# Patient Record
Sex: Female | Born: 2000
Health system: Southern US, Community
[De-identification: ages and names within clinical notes are randomized; demographics above are authoritative.]

## PROBLEM LIST (undated history)

## (undated) DIAGNOSIS — F32A Depression, unspecified: Secondary | ICD-10-CM

## (undated) DIAGNOSIS — F419 Anxiety disorder, unspecified: Secondary | ICD-10-CM

## (undated) DIAGNOSIS — F909 Attention-deficit hyperactivity disorder, unspecified type: Secondary | ICD-10-CM

## (undated) DIAGNOSIS — J45909 Unspecified asthma, uncomplicated: Secondary | ICD-10-CM

## (undated) DIAGNOSIS — F319 Bipolar disorder, unspecified: Secondary | ICD-10-CM

---
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
## 2021-01-04 MED FILL — Bupropion HCl Tab ER 24HR 300 MG: ORAL | 90 days supply | Qty: 90 | Fill #1 | Status: AC

## 2021-01-05 ENCOUNTER — Other Ambulatory Visit (HOSPITAL_COMMUNITY): Payer: Self-pay

## 2021-02-19 ENCOUNTER — Other Ambulatory Visit (HOSPITAL_COMMUNITY): Payer: Self-pay

## 2021-02-25 ENCOUNTER — Other Ambulatory Visit (HOSPITAL_COMMUNITY): Payer: Self-pay

## 2021-02-25 MED ORDER — AMPHETAMINE-DEXTROAMPHETAMINE 20 MG PO TABS
ORAL_TABLET | ORAL | 0 refills | Status: DC
  Filled 2021-02-25: qty 60, 30d supply, fill #0

## 2021-04-01 ENCOUNTER — Other Ambulatory Visit: Payer: Self-pay

## 2021-04-01 ENCOUNTER — Ambulatory Visit (INDEPENDENT_AMBULATORY_CARE_PROVIDER_SITE_OTHER): Payer: No Typology Code available for payment source | Admitting: Internal Medicine

## 2021-04-01 ENCOUNTER — Encounter: Payer: Self-pay | Admitting: Internal Medicine

## 2021-04-01 VITALS — BP 124/62 | HR 115 | Resp 18 | Ht 64.0 in | Wt 271.4 lb

## 2021-04-01 DIAGNOSIS — Z0000 Encounter for general adult medical examination without abnormal findings: Secondary | ICD-10-CM | POA: Diagnosis not present

## 2021-04-01 LAB — COMPREHENSIVE METABOLIC PANEL
ALT: 13 U/L (ref 0–35)
AST: 14 U/L (ref 0–37)
Albumin: 4.4 g/dL (ref 3.5–5.2)
Alkaline Phosphatase: 70 U/L (ref 47–119)
BUN: 12 mg/dL (ref 6–23)
CO2: 28 mEq/L (ref 19–32)
Calcium: 9.6 mg/dL (ref 8.4–10.5)
Chloride: 104 mEq/L (ref 96–112)
Creatinine, Ser: 0.91 mg/dL (ref 0.40–1.20)
GFR: 91.34 mL/min (ref >60.00)
Glucose, Bld: 87 mg/dL (ref 70–99)
Potassium: 3.9 mEq/L (ref 3.5–5.1)
Sodium: 139 mEq/L (ref 135–145)
Total Bilirubin: 0.3 mg/dL (ref 0.2–1.2)
Total Protein: 7.1 g/dL (ref 6.0–8.3)

## 2021-04-01 LAB — CBC
HCT: 38.3 % (ref 36.0–49.0)
Hemoglobin: 13 g/dL (ref 12.0–16.0)
MCHC: 34.1 g/dL (ref 31.0–37.0)
MCV: 86.9 fl (ref 78.0–98.0)
Platelets: 335 10*3/uL (ref 150.0–575.0)
RBC: 4.41 Mil/uL (ref 3.80–5.70)
RDW: 12.5 % (ref 11.4–15.5)
WBC: 7.8 10*3/uL (ref 4.5–13.5)

## 2021-04-01 LAB — TSH: TSH: 2.88 u[IU]/mL (ref 0.40–5.00)

## 2021-04-01 LAB — LIPID PANEL
Cholesterol: 134 mg/dL (ref 0–200)
HDL: 49.2 mg/dL (ref >39.00)
LDL Cholesterol: 73 mg/dL (ref 0–99)
NonHDL: 84.67
Total CHOL/HDL Ratio: 3
Triglycerides: 56 mg/dL (ref 0.0–149.0)
VLDL: 11.2 mg/dL (ref 0.0–40.0)

## 2021-04-01 LAB — HEMOGLOBIN A1C: Hgb A1c MFr Bld: 4.6 % (ref 4.6–6.5)

## 2021-04-01 NOTE — Progress Notes (Signed)
   Subjective:   Patient ID: Dawn Crosby, female    DOB: 2000-11-03, 20 y.o.   MRN: [id number]  HPI The patient is a new 20 YO female coming in for physical.   PMH, FMH, social history reviewed and updated  Review of Systems  Constitutional: Negative.   HENT: Negative.    Eyes: Negative.   Respiratory:  Negative for cough, chest tightness and shortness of breath.   Cardiovascular:  Negative for chest pain, palpitations and leg swelling.  Gastrointestinal:  Negative for abdominal distention, abdominal pain, constipation, diarrhea, nausea and vomiting.  Musculoskeletal: Negative.   Skin: Negative.   Neurological: Negative.   Psychiatric/Behavioral: Negative.     Objective:  Physical Exam Constitutional:      Appearance: She is well-developed. She is obese.  HENT:     Head: Normocephalic and atraumatic.  Cardiovascular:     Rate and Rhythm: Normal rate and regular rhythm.  Pulmonary:     Effort: Pulmonary effort is normal. No respiratory distress.     Breath sounds: Normal breath sounds. No wheezing or rales.  Abdominal:     General: Bowel sounds are normal. There is no distension.     Palpations: Abdomen is soft.     Tenderness: There is no abdominal tenderness. There is no rebound.  Musculoskeletal:     Cervical back: Normal range of motion.  Skin:    General: Skin is warm and dry.  Neurological:     Mental Status: She is alert and oriented to person, place, and time.     Coordination: Coordination normal.    Vitals:   04/01/21 1101  BP: 124/62  Pulse: (!) 115  Resp: 18  SpO2: 97%  Weight: 271 lb 6.4 oz (123.1 kg)  Height: 5\' 4"  (1.626 m)    This visit occurred during the SARS-CoV-2 public health emergency.  Safety protocols were in place, including screening questions prior to the visit, additional usage of staff PPE, and extensive cleaning of exam room while observing appropriate contact time as indicated for disinfecting solutions.   Assessment & Plan:

## 2021-04-01 NOTE — Patient Instructions (Addendum)
We will check the labs today. 

## 2021-04-02 ENCOUNTER — Other Ambulatory Visit (HOSPITAL_COMMUNITY): Payer: Self-pay

## 2021-04-02 DIAGNOSIS — Z0000 Encounter for general adult medical examination without abnormal findings: Secondary | ICD-10-CM | POA: Insufficient documentation

## 2021-04-02 MED ORDER — AMPHETAMINE-DEXTROAMPHETAMINE 20 MG PO TABS
ORAL_TABLET | ORAL | 0 refills | Status: DC
  Filled 2021-04-02: qty 60, 30d supply, fill #0

## 2021-04-02 NOTE — Assessment & Plan Note (Signed)
Checking labs including lipid and HgA1c given on mental health medications which can raise risk for metabolic syndrome and sugar and lipid problems.

## 2021-04-02 NOTE — Assessment & Plan Note (Signed)
Flu shot up to date. Covid-19 up to date. Tetanus up to date. Pap smear seeing gyn. Checking screening labs. Counseled about sun safety and mole surveillance. Counseled about the dangers of distracted driving. Given 10 year screening recommendations.

## 2021-04-07 ENCOUNTER — Other Ambulatory Visit (HOSPITAL_COMMUNITY): Payer: Self-pay

## 2021-04-09 ENCOUNTER — Other Ambulatory Visit (HOSPITAL_COMMUNITY): Payer: Self-pay

## 2021-04-09 MED ORDER — BUPROPION HCL ER (XL) 300 MG PO TB24
ORAL_TABLET | Freq: Every day | ORAL | 4 refills | Status: DC
  Filled 2021-04-09 (×2): qty 90, 90d supply, fill #0
  Filled 2021-07-07: qty 90, 90d supply, fill #1
  Filled 2021-10-13: qty 90, 90d supply, fill #2

## 2021-04-15 ENCOUNTER — Other Ambulatory Visit (HOSPITAL_COMMUNITY): Payer: Self-pay

## 2021-04-15 MED FILL — Lamotrigine Tab 200 MG: ORAL | 90 days supply | Qty: 90 | Fill #2 | Status: AC

## 2021-04-26 ENCOUNTER — Other Ambulatory Visit (HOSPITAL_COMMUNITY): Payer: Self-pay

## 2021-04-27 ENCOUNTER — Other Ambulatory Visit (HOSPITAL_COMMUNITY): Payer: Self-pay

## 2021-04-27 MED ORDER — IBUPROFEN 800 MG PO TABS
ORAL_TABLET | ORAL | 0 refills | Status: AC
  Filled 2021-04-27: qty 30, 10d supply, fill #0

## 2021-04-27 MED ORDER — HYDROCODONE-ACETAMINOPHEN 5-325 MG PO TABS
ORAL_TABLET | ORAL | 0 refills | Status: DC
  Filled 2021-04-27: qty 12, 4d supply, fill #0

## 2021-05-13 ENCOUNTER — Other Ambulatory Visit (HOSPITAL_COMMUNITY): Payer: Self-pay

## 2021-05-13 MED ORDER — AMPHETAMINE-DEXTROAMPHETAMINE 20 MG PO TABS
20.0000 mg | ORAL_TABLET | Freq: Two times a day (BID) | ORAL | 0 refills | Status: DC
  Filled 2021-05-13: qty 60, 30d supply, fill #0

## 2021-06-16 ENCOUNTER — Other Ambulatory Visit (HOSPITAL_COMMUNITY): Payer: Self-pay

## 2021-06-16 MED ORDER — AMPHETAMINE-DEXTROAMPHETAMINE 20 MG PO TABS
ORAL_TABLET | ORAL | 0 refills | Status: DC
  Filled 2021-06-16: qty 60, 30d supply, fill #0

## 2021-06-17 ENCOUNTER — Other Ambulatory Visit (HOSPITAL_COMMUNITY): Payer: Self-pay

## 2021-06-29 ENCOUNTER — Encounter (HOSPITAL_COMMUNITY): Payer: Self-pay | Admitting: Licensed Clinical Social Worker

## 2021-06-29 ENCOUNTER — Ambulatory Visit (INDEPENDENT_AMBULATORY_CARE_PROVIDER_SITE_OTHER): Payer: No Typology Code available for payment source | Admitting: Licensed Clinical Social Worker

## 2021-06-29 ENCOUNTER — Other Ambulatory Visit: Payer: Self-pay

## 2021-06-29 DIAGNOSIS — F900 Attention-deficit hyperactivity disorder, predominantly inattentive type: Secondary | ICD-10-CM

## 2021-06-29 DIAGNOSIS — F319 Bipolar disorder, unspecified: Secondary | ICD-10-CM

## 2021-06-29 DIAGNOSIS — F4010 Social phobia, unspecified: Secondary | ICD-10-CM

## 2021-06-29 NOTE — Progress Notes (Signed)
Virtual Visit via Video Note  I connected with Dawn Crosby on 06/29/21 at 11:00 AM EST by a video enabled telemedicine application and verified that I am speaking with the correct person using two identifiers.  Location: Patient: home Provider: office   I discussed the limitations of evaluation and management by telemedicine and the availability of in person appointments. The patient expressed understanding and agreed to proceed.  Type of Therapy: Individual Therapy   Treatment Goals addressed: "coping with stress, transitions, social anxiety". Dawn Crosby will improve her anxiety 5 out of 7 days as evidenced by reduction of sxs, ability to use coping skills, and increased confidence in herself and her decisions.    Interventions:  Motivational Interviewing   Summary: Dawn Crosby is a 21 y.o. female who presents with Bipolar Disorder, Social Anxiety and ADHD predominantly inattentive presentation   Suicidal/Homicidal: No without intent/plan   Therapist Response: Benjamine Mola met with clinician for an individual session. Chirstine discussed her psychiatric symptoms, her current life events and her homework.  Clinician received updates over the past several months since last session. Clinician utilized MI OARS to reflect and summarize thoughts and feelings of grief after the death of her pet rabbit. Clinician provided psychoeducation about grief and how it changes over time. 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 Clinician provided supportive counseling and discussed ways to seek support and love from her social network. Dawn Crosby provided updates about boyfriend and relationship.     Diagnosis: Bipolar Disorder, Social Anxiety and ADHD, inattentive   Plan: Next appointment in 2-3 weeks.      I discussed the assessment and treatment plan with the patient. The patient was provided an opportunity to ask questions and  all were answered. The patient agreed with the plan and demonstrated an understanding of the instructions.   The patient was advised to call back or seek an in-person evaluation if the symptoms worsen or if the condition fails to improve as anticipated.  I provided 55 minutes of non-face-to-face time during this encounter.   [name], LCSW

## 2021-07-01 ENCOUNTER — Ambulatory Visit (INDEPENDENT_AMBULATORY_CARE_PROVIDER_SITE_OTHER): Payer: No Typology Code available for payment source | Admitting: Adult Health

## 2021-07-01 ENCOUNTER — Other Ambulatory Visit: Payer: Self-pay

## 2021-07-01 ENCOUNTER — Encounter: Payer: Self-pay | Admitting: Adult Health

## 2021-07-01 VITALS — BP 107/69 | HR 96 | Ht 64.0 in | Wt 270.0 lb

## 2021-07-01 DIAGNOSIS — R112 Nausea with vomiting, unspecified: Secondary | ICD-10-CM | POA: Insufficient documentation

## 2021-07-01 DIAGNOSIS — F330 Major depressive disorder, recurrent, mild: Secondary | ICD-10-CM | POA: Diagnosis not present

## 2021-07-01 DIAGNOSIS — K8020 Calculus of gallbladder without cholecystitis without obstruction: Secondary | ICD-10-CM | POA: Insufficient documentation

## 2021-07-01 DIAGNOSIS — F4010 Social phobia, unspecified: Secondary | ICD-10-CM

## 2021-07-01 DIAGNOSIS — F900 Attention-deficit hyperactivity disorder, predominantly inattentive type: Secondary | ICD-10-CM

## 2021-07-01 DIAGNOSIS — R7989 Other specified abnormal findings of blood chemistry: Secondary | ICD-10-CM | POA: Insufficient documentation

## 2021-07-01 DIAGNOSIS — F319 Bipolar disorder, unspecified: Secondary | ICD-10-CM

## 2021-07-01 DIAGNOSIS — R1011 Right upper quadrant pain: Secondary | ICD-10-CM | POA: Insufficient documentation

## 2021-07-01 NOTE — Progress Notes (Signed)
[facility] MD/PA/NP Initial Note  07/01/2021 2:05 PM Dawn Crosby  MRN:  [id number]  Chief Complaint:   HPI:   Dawn Crosby is a 21 year old female seen today for initial psychiatric evaluation.   Describes mood today as "ok". Pleasant. Tearful at times. Mood symptoms - reports some depression, anxiety, and irritability - nothing overwhelming. Reports some highs and lows - mood more consistent with Lamictal. Followed by Dr. Toni Arthurs for the past 8 years and he is retiring soon. Feels stable with current medications and is hoping to establish care. Feels like current medications are working well. Stable interest and motivation. Taking medications as prescribed.  Started at [employer] after graduation - had gallbladder removal first semester and had a brake up her boyfriend the second semester. Thinking about returning to school to be a Runner, broadcasting/film/video. Has a passion for animals. Wanted to be an Administrator, arts. Had a rabbit "Tip" that passed in August of 2022 - had him for 6 and 1/2 Flunked out of school - put on probation. Feels like she is doing well - just trying to get things back on track.  Energy levels varies. Active, does not have a regular exercise routine.  Enjoys some usual interests and activities. Dating. Has a boyfriend. Spending time with family. Appetite adequate. Weight gain over the past year - 270 pounds. Sleeps well most nights. Averages 8 or more hours. Focus and concentration stable. Completing tasks. Managing aspects of household. Not employed currently - looking for a job. Denies SI or HI.  Denies AH or VH. Seeing therapist.   Previous medication trials: Prozac - rash.  Visit Diagnosis:    ICD-10-CM   1. Bipolar I disorder (HCC)  F31.9     2. ADHD (attention deficit hyperactivity disorder), inattentive type  F90.0     3. Social anxiety disorder  F40.10     4. MDD (major depressive disorder), recurrent episode, mild (HCC)  F33.0     5. Social phobia  F40.10        Past Psychiatric History: Denies psychiatric hospitalization.   Past Medical History:  Past Medical History:  Diagnosis Date   ADHD    Anxiety    Asthma    as child    Bipolar disorder (HCC)    Depression     Past Surgical History:  Procedure Laterality Date   CHOLECYSTECTOMY N/A 01/28/2020   Procedure: LAPAROSCOPIC CHOLECYSTECTOMY;  Surgeon: [name], MD;  Location: [facility];  Service: General;  Laterality: N/A;  GENERAL AND TAP BLOCK    Family Psychiatric History: Family history of mental illness.   Family History:  Family History  Problem Relation Age of Onset   Diabetes Mother    Depression Mother    Hyperlipidemia Father    Hypertension Father    Diabetes Father    Hyperlipidemia Maternal Grandfather     Social History:  Social History   Socioeconomic History   Marital status: Single    Spouse name: Not on file   Number of children: Not on file   Years of education: Not on file   Highest education level: Not on file  Occupational History   Not on file  Tobacco Use   Smoking status: Never   Smokeless tobacco: Never  Vaping Use   Vaping Use: Some days  Substance and Sexual Activity   Alcohol use: Not Currently   Drug use: Not Currently   Sexual activity: Not on file  Other Topics Concern   Not  on file  Social History Narrative   Not on file   Social Determinants of Health   Financial Resource Strain: Not on file  Food Insecurity: Not on file  Transportation Needs: Not on file  Physical Activity: Not on file  Stress: Not on file  Social Connections: Not on file    Allergies:  Allergies  Allergen Reactions   Bactrim [Sulfamethoxazole-Trimethoprim] Hives    Possible hives; pt was on bactrim and pcn, unsure which caused hives   Clindamycin/Lincomycin Hives   Kiwi Extract Other (See Comments)    Tongue tingling  Other reaction(s): tongue tingle   Penicillins Hives    Possible hives; pt was on bactrim and pcn,  unsure which caused hives   Pineapple Other (See Comments)    Tongue allergy  Other reaction(s): tongue tingle   Prozac [Fluoxetine Hcl] Hives   Adhesive [Tape] Rash   Clindamycin Rash   Fluoxetine Rash    Other reaction(s): hives, itching    Metabolic Disorder Labs: Lab Results  Component Value Date   HGBA1C 4.6 04/01/2021   No results found for: PROLACTIN Lab Results  Component Value Date   CHOL 134 04/01/2021   TRIG 56.0 04/01/2021   HDL 49.20 04/01/2021   CHOLHDL 3 04/01/2021   VLDL 11.2 04/01/2021   LDLCALC 73 04/01/2021   LDLCALC 63 12/31/2015   Lab Results  Component Value Date   TSH 2.88 04/01/2021   TSH 2.93 12/31/2015    Therapeutic Level Labs: No results found for: LITHIUM No results found for: VALPROATE No components found for:  CBMZ  Current Medications: Current Outpatient Medications  Medication Sig Dispense Refill   amphetamine-dextroamphetamine (ADDERALL) 20 MG tablet TAKE 1 TABLET BY MOUTH 2 TIMES DAILY FOR ADD 60 tablet 0   amphetamine-dextroamphetamine (ADDERALL) 20 MG tablet TAKE 1 TABLET BY MOUTH 2 TIMES DAILY 60 tablet 0   amphetamine-dextroamphetamine (ADDERALL) 30 MG tablet Take 40 mg by mouth daily.     buPROPion (WELLBUTRIN XL) 300 MG 24 hr tablet TAKE 1 TABLET BY MOUTH ONCE DAILY 90 tablet 4   busPIRone (BUSPAR) 10 MG tablet Take 1 tablet by mouth 3 times a day (Patient taking differently: Take 10 mg by mouth 2 (two) times daily.) 90 tablet 5   cetirizine (ZYRTEC) 10 MG tablet Take 10 mg by mouth at bedtime.     clonazePAM (KLONOPIN) 0.5 MG tablet Take 1 tablet by mouth once a day as needed for anxiety or panic 30 tablet 3   co-enzyme Q-10 30 MG capsule Take 30 mg by mouth daily.     diphenhydrAMINE (BENADRYL) 25 mg capsule Take 1 each (25 mg total) by mouth every 6 (six) hours as needed for allergies for up to 5 days. 12 capsule 0   docusate sodium (COLACE) 100 MG capsule Take 100 mg by mouth at bedtime.     HYDROcodone-acetaminophen  (NORCO/VICODIN) 5-325 MG tablet Take 1 tablet by mouth every 6 hours as needed for breakthrough pain 12 tablet 0   ibuprofen (ADVIL) 800 MG tablet Take 1 tablet by mouth every 8 hours with food 30 tablet 0   lamoTRIgine (LAMICTAL) 200 MG tablet TAKE 1 TABLET BY MOUTH ONCE A DAY 90 tablet 99   melatonin 5 MG TABS Take 5 mg by mouth at bedtime.     Multiple Vitamin (MULTIVITAMIN) capsule Take 1 capsule by mouth daily.     Omega-3 1000 MG CAPS Take 1 capsule by mouth at bedtime.     QUEtiapine (SEROQUEL) 100  MG tablet Take 1/2 to 1 tablet by mouth as needed for sleep 30 tablet 3   No current facility-administered medications for this visit.    Medication Side Effects: none  Orders placed this visit:  No orders of the defined types were placed in this encounter.   Psychiatric Specialty Exam:  Review of Systems  Musculoskeletal:  Negative for gait problem.  Neurological:  Negative for tremors.  Psychiatric/Behavioral:         Please refer to HPI   Blood pressure 107/69, pulse 96, height 5\' 4"  (1.626 m), weight 270 lb (122.5 kg).Body mass index is 46.35 kg/m.  General Appearance: Casual and Neat  Eye Contact:  Good  Speech:  Clear and Coherent and Normal Rate  Volume:  Normal  Mood:  Euthymic  Affect:  Appropriate and Congruent  Thought Process:  Coherent and Descriptions of Associations: Intact  Orientation:  Full (Time, Place, and Person)  Thought Content: Logical   Suicidal Thoughts:  No  Homicidal Thoughts:  No  Memory:  WNL  Judgement:  Good  Insight:  Good  Psychomotor Activity:  Normal  Concentration:  Concentration: Good  Recall:  Good  Fund of Knowledge: Good  Language: Good  Assets:  Communication Skills Desire for Improvement Financial Resources/Insurance Housing Intimacy Leisure Time Physical Health Resilience Social Support Talents/Skills Transportation Vocational/Educational  ADL's:  Intact  Cognition: WNL  Prognosis:  Good   Screenings:  Flowsheet  Row ED from 08/31/2020 in [facility] RISK CATEGORY No Risk      Receiving Psychotherapy: No   Treatment Plan/Recommendations:  Plan:  PDMP reviewed  Adderall 20mg  BID Buspar 10mg  BID Lamictal 200mg  daily Wellbutrin XL 300mg  in the am Seroquel 100mg  - 1/2 to one tablet as needed Clonazepam 0.5mg  as needed for anxiety  Will call if needs medications between visits.  3 months  Discussed potential benefits, risk, and side effects of benzodiazepines to include potential risk of tolerance and dependence, as well as possible drowsiness.  Advised patient not to drive if experiencing drowsiness and to take lowest possible effective dose to minimize risk of dependence and tolerance.   Discussed potential benefits, risks, and side effects of stimulants with patient to include increased heart rate, palpitations, insomnia, increased anxiety, increased irritability, or decreased appetite.  Instructed patient to contact office if experiencing any significant tolerability issues.   Discussed potential metabolic side effects associated with atypical antipsychotics, as well as potential risk for movement side effects. Advised pt to contact office if movement side effects occur.    Counseled patient regarding potential benefits, risks, and side effects of Lamictal to include potential risk of Stevens-Johnson syndrome. Advised patient to stop taking Lamictal and contact office immediately if rash develops and to seek urgent medical attention if rash is severe and/or spreading quickly. Will start Lamictal 25 mg daily for 2 weeks, then increase to 50 mg daily for 2 weeks, then 100 mg daily for 2 weeks, then 150 mg daily for mood symptoms.    Patient advised to contact office with any questions, adverse effects, or acute worsening in signs and symptoms.      [facility], NP

## 2021-07-08 ENCOUNTER — Other Ambulatory Visit (HOSPITAL_COMMUNITY): Payer: Self-pay

## 2021-07-15 ENCOUNTER — Ambulatory Visit (HOSPITAL_COMMUNITY): Payer: No Typology Code available for payment source | Admitting: Licensed Clinical Social Worker

## 2021-07-15 ENCOUNTER — Telehealth: Payer: Self-pay | Admitting: Adult Health

## 2021-07-15 ENCOUNTER — Other Ambulatory Visit (HOSPITAL_COMMUNITY): Payer: Self-pay

## 2021-07-15 ENCOUNTER — Other Ambulatory Visit: Payer: Self-pay

## 2021-07-15 NOTE — Telephone Encounter (Signed)
Pt stated she just needs you to send rx since she is no longer getting it from that provider

## 2021-07-15 NOTE — Telephone Encounter (Signed)
We can discuss at next appointment.

## 2021-07-15 NOTE — Telephone Encounter (Signed)
Patient called in stating that she was on Lamictal provider from a different provider. She would like the Lamictal switched to CR. Pls rtc [phone]. Appt 5/16. Pharmacy [facility] [place] ?

## 2021-07-16 ENCOUNTER — Other Ambulatory Visit: Payer: Self-pay

## 2021-07-16 ENCOUNTER — Other Ambulatory Visit (HOSPITAL_COMMUNITY): Payer: Self-pay

## 2021-07-16 MED ORDER — LAMOTRIGINE 200 MG PO TABS
ORAL_TABLET | Freq: Every day | ORAL | 0 refills | Status: DC
  Filled 2021-07-16: qty 90, 90d supply, fill #0

## 2021-07-16 NOTE — Telephone Encounter (Signed)
Noted  

## 2021-07-16 NOTE — Telephone Encounter (Signed)
We can send in current script. Why does she want to switch to a CR? ?

## 2021-07-16 NOTE — Telephone Encounter (Signed)
Pt has appt on 5/16, does she need to move it up?She will be out of meds

## 2021-07-16 NOTE — Telephone Encounter (Signed)
She does not.When I spoke to her she clarified and just needs what she normally takes.I will send

## 2021-07-29 ENCOUNTER — Other Ambulatory Visit: Payer: Self-pay

## 2021-07-29 ENCOUNTER — Ambulatory Visit (HOSPITAL_COMMUNITY): Payer: No Typology Code available for payment source | Admitting: Licensed Clinical Social Worker

## 2021-08-05 ENCOUNTER — Telehealth: Payer: Self-pay | Admitting: Adult Health

## 2021-08-05 ENCOUNTER — Other Ambulatory Visit (HOSPITAL_COMMUNITY): Payer: Self-pay

## 2021-08-05 ENCOUNTER — Other Ambulatory Visit: Payer: Self-pay

## 2021-08-05 MED ORDER — AMPHETAMINE-DEXTROAMPHETAMINE 20 MG PO TABS
ORAL_TABLET | ORAL | 0 refills | Status: DC
  Filled 2021-08-05: qty 60, 30d supply, fill #0

## 2021-08-05 NOTE — Telephone Encounter (Signed)
Pended.

## 2021-08-05 NOTE — Telephone Encounter (Signed)
Dawn Crosby called at 11:46 to request refill of her Adderall 20 mg 2/day.  Appt 09/28/21.  Send to [facility].  She said they have it. ?

## 2021-08-10 NOTE — Progress Notes (Signed)
?Dawn Crosby D.O. ?Dawn Crosby ?[address] [place] [address] ?Phone: [phone] ?Subjective:   ?I, Dawn Crosby, am serving as a scribe for Dr. Antoine Crosby. ? ?This visit occurred during the SARS-CoV-2 public health emergency.  Safety protocols were in place, including screening questions prior to the visit, additional usage of staff PPE, and extensive cleaning of exam room while observing appropriate contact time as indicated for disinfecting solutions.  ? ? ?I'm seeing this patient by the request  of:  Dawn Broker, MD ? ?CC: left wrist pain  ? ?[id number]  ?Dawn Crosby is a 21 y.o. female coming in with complaint of left wrist pain. Patient states that she has had L wrist pain for past 2 years that is intermittent. Pain CMC joint and back of wrist. Has had wrist adjusted which helped temporarily.   ? ? ? ?  ? ?Past Medical History:  ?Diagnosis Date  ? ADHD   ? Anxiety   ? Asthma   ? as child   ? Bipolar disorder (HCC)   ? Depression   ? ?Past Surgical History:  ?Procedure Laterality Date  ? CHOLECYSTECTOMY N/A 01/28/2020  ? Procedure: LAPAROSCOPIC CHOLECYSTECTOMY;  Surgeon: Dawn Loron, MD;  Location: [facility];  Service: General;  Laterality: N/A;  GENERAL AND TAP BLOCK  ? ?Social History  ? ?Socioeconomic History  ? Marital status: Single  ?  Spouse name: Not on file  ? Number of children: Not on file  ? Years of education: Not on file  ? Highest education level: Not on file  ?Occupational History  ? Not on file  ?Tobacco Use  ? Smoking status: Never  ? Smokeless tobacco: Never  ?Vaping Use  ? Vaping Use: Some days  ?Substance and Sexual Activity  ? Alcohol use: Not Currently  ? Drug use: Not Currently  ? Sexual activity: Not on file  ?Other Topics Concern  ? Not on file  ?Social History Narrative  ? Not on file  ? ?Social Determinants of Health  ? ?Financial Resource Strain: Not on file  ?Food Insecurity: Not on file  ?Transportation Needs: Not  on file  ?Physical Activity: Not on file  ?Stress: Not on file  ?Social Connections: Not on file  ? ?Allergies  ?Allergen Reactions  ? Bactrim [Sulfamethoxazole-Trimethoprim] Hives  ?  Possible hives; pt was on bactrim and pcn, unsure which caused hives  ? Clindamycin/Lincomycin Hives  ? Kiwi Extract Other (See Comments)  ?  Tongue tingling ? ?Other reaction(s): tongue tingle  ? Penicillins Hives  ?  Possible hives; pt was on bactrim and pcn, unsure which caused hives  ? Pineapple Other (See Comments)  ?  Tongue allergy ? ?Other reaction(s): tongue tingle  ? Prozac [Fluoxetine Hcl] Hives  ? Adhesive [Tape] Rash  ? Clindamycin Rash  ? Fluoxetine Rash  ?  Other reaction(s): hives, itching  ? ?Family History  ?Problem Relation Age of Onset  ? Diabetes Mother   ? Depression Mother   ? Hyperlipidemia Father   ? Hypertension Father   ? Diabetes Father   ? Hyperlipidemia Maternal Grandfather   ? ? ? ? ?Current Outpatient Medications (Respiratory):  ?  cetirizine (ZYRTEC) 10 MG tablet, Take 10 mg by mouth at bedtime. ?  diphenhydrAMINE (BENADRYL) 25 mg capsule, Take 1 each (25 mg total) by mouth every 6 (six) hours as needed for allergies for up to 5 days. ? ?Current Outpatient Medications (Analgesics):  ?  ibuprofen (ADVIL)  800 MG tablet, Take 1 tablet by mouth every 8 hours with food ?  HYDROcodone-acetaminophen (NORCO/VICODIN) 5-325 MG tablet, Take 1 tablet by mouth every 6 hours as needed for breakthrough pain (Patient not taking: Reported on 07/29/2021) ? ? ?Current Outpatient Medications (Other):  ?  amphetamine-dextroamphetamine (ADDERALL) 20 MG tablet, TAKE 1 TABLET BY MOUTH 2 TIMES DAILY FOR ADD ?  buPROPion (WELLBUTRIN XL) 300 MG 24 hr tablet, TAKE 1 TABLET BY MOUTH ONCE DAILY ?  busPIRone (BUSPAR) 10 MG tablet, Take 1 tablet by mouth 3 times a day (Patient taking differently: Take 10 mg by mouth 2 (two) times daily.) ?  clonazePAM (KLONOPIN) 0.5 MG tablet, Take 1 tablet by mouth once a day as needed for anxiety or  panic ?  co-enzyme Q-10 30 MG capsule, Take 30 mg by mouth daily. ?  docusate sodium (COLACE) 100 MG capsule, Take 100 mg by mouth at bedtime. ?  lamoTRIgine (LAMICTAL) 200 MG tablet, TAKE 1 TABLET BY MOUTH ONCE A DAY ?  melatonin 5 MG TABS, Take 5 mg by mouth at bedtime. ?  Multiple Vitamin (MULTIVITAMIN) capsule, Take 1 capsule by mouth daily. ?  Omega-3 1000 MG CAPS, Take 1 capsule by mouth at bedtime. ?  QUEtiapine (SEROQUEL) 100 MG tablet, Take 1/2 to 1 tablet by mouth as needed for sleep ?  amphetamine-dextroamphetamine (ADDERALL) 20 MG tablet, TAKE 1 TABLET BY MOUTH 2 TIMES DAILY ?  amphetamine-dextroamphetamine (ADDERALL) 30 MG tablet, Take 40 mg by mouth daily. ? ? ?Reviewed prior external information including notes and imaging from  ?primary care provider ?As well as notes that were available from care everywhere and other healthcare systems. ? ?Past medical history, social, surgical and family history all reviewed in electronic medical record.  No pertanent information unless stated regarding to the chief complaint.  ? ?Review of Systems: ? No headache, visual changes, nausea, vomiting, diarrhea, constipation, dizziness, abdominal pain, skin rash, fevers, chills, night sweats, weight loss, swollen lymph nodes, body aches, joint swelling, chest pain, shortness of breath, mood changes. POSITIVE muscle aches ? ?Objective  ?Blood pressure 104/74, pulse (!) 112, height 5\' 4"  (1.626 m), weight 270 lb (122.5 kg). ?  ?General: No apparent distress alert and oriented x3 mood and affect normal, dressed appropriately.  ?HEENT: Pupils equal, extraocular movements intact  ?Respiratory: Patient's speak in full sentences and does not appear short of breath  ?Cardiovascular: No lower extremity edema, non tender, no erythema  ?Gait normal with good balance and coordination.  ?MSK: Left wrist moderately tender at the [facility] joint with just proximal to it in that area.  Possible finding noted on the volar aspect of the wrist.   Patient has good grip strength noted.  Mild tenderness over the scapholunate ligament but no significant instability ? ?Limited muscular skeletal ultrasound was performed and interpreted by [facility], M  ?Limited ultrasound shows the patient does have a hypoechoic mass noted just proximal to the [facility] joint.  This is deep to the radial artery.  It does seem to be a bit ganglion cyst.  No blood flow in the area.  No abnormal blood vessels. ?Impression: Likely ganglion cyst. ? ?  ?Impression and Recommendations:  ?  ? ?The above documentation has been reviewed and is accurate and complete [facility], DO ? ? ? ?

## 2021-08-11 ENCOUNTER — Ambulatory Visit: Payer: Self-pay

## 2021-08-11 ENCOUNTER — Ambulatory Visit: Payer: No Typology Code available for payment source | Admitting: Family Medicine

## 2021-08-11 VITALS — BP 104/74 | HR 112 | Ht 64.0 in | Wt 270.0 lb

## 2021-08-11 DIAGNOSIS — M67432 Ganglion, left wrist: Secondary | ICD-10-CM | POA: Diagnosis not present

## 2021-08-11 DIAGNOSIS — M25532 Pain in left wrist: Secondary | ICD-10-CM

## 2021-08-11 NOTE — Patient Instructions (Signed)
Ganglion cyst ?Watch it and it might pop-will be painful for just a couple of days ?C[place]an with activity ?See me if it enlarges otherwise as needed ?

## 2021-08-11 NOTE — Assessment & Plan Note (Signed)
Does appear to be a ganglion cyst noted.  Discussed with patient that this is underneath the radial artery which would make aspiration somewhat difficult.  Discussed that the likelihood is this could potentially resolve to on its own .  Patient has been doing more repetitive lifting recently and probably did cause some increase in size recently.  Patient will follow-up with me again if any enlargement or worsening pain. ?

## 2021-08-12 ENCOUNTER — Ambulatory Visit (INDEPENDENT_AMBULATORY_CARE_PROVIDER_SITE_OTHER): Payer: No Typology Code available for payment source | Admitting: Licensed Clinical Social Worker

## 2021-08-12 DIAGNOSIS — F900 Attention-deficit hyperactivity disorder, predominantly inattentive type: Secondary | ICD-10-CM

## 2021-08-12 DIAGNOSIS — F319 Bipolar disorder, unspecified: Secondary | ICD-10-CM | POA: Diagnosis not present

## 2021-08-12 DIAGNOSIS — F4010 Social phobia, unspecified: Secondary | ICD-10-CM | POA: Diagnosis not present

## 2021-08-16 ENCOUNTER — Ambulatory Visit: Payer: No Typology Code available for payment source | Admitting: Internal Medicine

## 2021-08-16 ENCOUNTER — Encounter (HOSPITAL_COMMUNITY): Payer: Self-pay | Admitting: Licensed Clinical Social Worker

## 2021-08-16 NOTE — Progress Notes (Signed)
Virtual Visit via Video Note ? ?I connected with Dawn Crosby on 08/16/21 at  1:30 PM EDT by a video enabled telemedicine application and verified that I am speaking with the correct person using two identifiers. ? ?Location: ?Patient: home ?Provider: home office ?  ?I discussed the limitations of evaluation and management by telemedicine and the availability of in person appointments. The patient expressed understanding and agreed to proceed. ?  ?I discussed the assessment and treatment plan with the patient. The patient was provided an opportunity to ask questions and all were answered. The patient agreed with the plan and demonstrated an understanding of the instructions. ?  ?The patient was advised to call back or seek an in-person evaluation if the symptoms worsen or if the condition fails to improve as anticipated. ? ?I provided 55 minutes of non-face-to-face time during this encounter. ? ? ?[name], LCSW  ? ?THERAPIST PROGRESS NOTE ? ?Session Time: 1:30pm-2:25pm ? ?Participation Level: Active ? ?Behavioral Response: NeatAlertEuthymic ? ?Type of Therapy: Individual Therapy ? ?Treatment Goals addressed: "coping with stress, transitions, social anxiety". Dawn Crosby will improve her anxiety 5 out of 7 days as evidenced by reduction of sxs, ability to use coping skills, and increased confidence in herself and her decisions.  ? ?ProgressTowards Goals: Progressing ? ?Interventions: CBT ? ?Summary: MARICELLA FILYAW is a 21 y.o. female who presents with Bipolar I Disorder, Social Anxiety, and ADHD, inattentive.  ? ?Suicidal/Homicidal: Nowithout intent/plan ? ?Therapist Response: Dawn Crosby engaged well in individual session with clinician. Clinician utilized CBT to process updates in life at home and in community. Dawn Crosby shared ongoing progress in cleaning out her room. She also shared plans to paint and redo flooring. Clinician explored interactions with friends and family. Dawn Crosby provided updates about the  drama of her friends and family's lives. Clinician processed Dawn Crosby's ability to maintain appropriate boundaries with her friends and family who have a lot of "drama" that she gets involved in. Clinician discussed plans for the future. Dawn Crosby identified a semi-crisis in that she always wanted to be a International aid/development worker, but she said she could not deal with the constant flow of euthanasia and sick or dying animals. Clinician agreed and explored alternatives. Dawn Crosby shared some interest in becoming an Insurance account manager, possibly an Wellsite geologist. Clinician agreed that this would be a nice area to focus her attention. Clinician provided [email or web address] for career testing.  ? ?Plan: Return again in 2 weeks. ? ?Diagnosis: Bipolar I disorder (HCC) ? ?Social anxiety disorder ? ?ADHD (attention deficit hyperactivity disorder), inattentive type ? ?Collaboration of Care: Other none required at this session.  ? ?Patient/Guardian was advised Release of Information must be obtained prior to any record release in order to collaborate their care with an outside provider. Patient/Guardian was advised if they have not already done so to contact the registration department to sign all necessary forms in order for [place] to release information regarding their care.  ? ?Consent: Patient/Guardian gives verbal consent for treatment and assignment of benefits for services provided during this visit. Patient/Guardian expressed understanding and agreed to proceed.  ? ?[name], LCSW ?08/16/2021 ? ?

## 2021-09-01 ENCOUNTER — Ambulatory Visit: Payer: No Typology Code available for payment source | Admitting: Internal Medicine

## 2021-09-02 ENCOUNTER — Ambulatory Visit (HOSPITAL_COMMUNITY): Payer: No Typology Code available for payment source | Admitting: Licensed Clinical Social Worker

## 2021-09-09 ENCOUNTER — Other Ambulatory Visit (HOSPITAL_COMMUNITY): Payer: Self-pay

## 2021-09-09 ENCOUNTER — Other Ambulatory Visit: Payer: Self-pay | Admitting: Adult Health

## 2021-09-09 MED ORDER — AMPHETAMINE-DEXTROAMPHETAMINE 20 MG PO TABS
ORAL_TABLET | ORAL | 0 refills | Status: DC
  Filled 2021-09-09: qty 60, 30d supply, fill #0

## 2021-09-10 ENCOUNTER — Other Ambulatory Visit (HOSPITAL_COMMUNITY): Payer: Self-pay

## 2021-09-22 ENCOUNTER — Encounter (HOSPITAL_COMMUNITY): Payer: Self-pay | Admitting: Licensed Clinical Social Worker

## 2021-09-22 ENCOUNTER — Ambulatory Visit (INDEPENDENT_AMBULATORY_CARE_PROVIDER_SITE_OTHER): Payer: No Typology Code available for payment source | Admitting: Licensed Clinical Social Worker

## 2021-09-22 DIAGNOSIS — F900 Attention-deficit hyperactivity disorder, predominantly inattentive type: Secondary | ICD-10-CM

## 2021-09-22 DIAGNOSIS — F4010 Social phobia, unspecified: Secondary | ICD-10-CM

## 2021-09-22 DIAGNOSIS — F319 Bipolar disorder, unspecified: Secondary | ICD-10-CM | POA: Diagnosis not present

## 2021-09-22 NOTE — Progress Notes (Signed)
Virtual Visit via Video Note ? ?I connected with Dawn Crosby on 09/22/21 at  1:30 PM EDT by a video enabled telemedicine application and verified that I am speaking with the correct person using two identifiers. ? ?Location: ?Patient: home ?Provider: home office ?  ?I discussed the limitations of evaluation and management by telemedicine and the availability of in person appointments. The patient expressed understanding and agreed to proceed. ? ?  ?I discussed the assessment and treatment plan with the patient. The patient was provided an opportunity to ask questions and all were answered. The patient agreed with the plan and demonstrated an understanding of the instructions. ?  ?The patient was advised to call back or seek an in-person evaluation if the symptoms worsen or if the condition fails to improve as anticipated. ? ?I provided 55 minutes of non-face-to-face time during this encounter. ? ? ?[name], LCSW  ? ?THERAPIST PROGRESS NOTE ? ?Session Time: 1:30pm-2:25pm ? ?Participation Level: Active ? ?Behavioral Response: CasualAlertEuthymic ? ?Type of Therapy: Individual Therapy ? ?Treatment Goals addressed:  "coping with stress, transitions, social anxiety". Dawn Crosby will improve her anxiety 5 out of 7 days as evidenced by reduction of sxs, ability to use coping skills, and increased confidence in herself and her decisions.  ? ?ProgressTowards Goals: Progressing ? ?Interventions: CBT ? ?Summary: Dawn Crosby is a 21 y.o. female who presents with Bipolar I Disorder, Social Anxiety Disorder, and ADHD inattentive .  ? ?Suicidal/Homicidal: Nowithout intent/plan ? ?Therapist Response: Dawn Crosby engaged well in individual telehealth session. Clinician processed interactions with family and friends since last session. Clinician processed updates in relationship with boyfriend, noting increased visits and positive relationship with him and his family. Clinician discussed differences between this  relationship and past relationships. Clinician also processed different choices [name] makes and how she can be more honest and feel better supported by this boyfriend. Clinician discussed updates in life, work, cleaning her room, etc. Clinician utilized CBT in processing her decision making as well as in thinking about the present and the future.  ? ?Plan: Return again in 2 weeks. ? ?Diagnosis: Bipolar I disorder (HCC) ? ?Social anxiety disorder ? ?ADHD (attention deficit hyperactivity disorder), inattentive type ? ?Collaboration of Care: Other none required in this session. Reviewed last note from med provider ? ?Patient/Guardian was advised Release of Information must be obtained prior to any record release in order to collaborate their care with an outside provider. Patient/Guardian was advised if they have not already done so to contact the registration department to sign all necessary forms in order for [place] to release information regarding their care.  ? ?Consent: Patient/Guardian gives verbal consent for treatment and assignment of benefits for services provided during this visit. Patient/Guardian expressed understanding and agreed to proceed.  ? ?[name], LCSW ?09/22/2021 ? ?

## 2021-09-28 ENCOUNTER — Ambulatory Visit: Payer: No Typology Code available for payment source | Admitting: Adult Health

## 2021-10-06 ENCOUNTER — Ambulatory Visit (INDEPENDENT_AMBULATORY_CARE_PROVIDER_SITE_OTHER): Payer: No Typology Code available for payment source | Admitting: Licensed Clinical Social Worker

## 2021-10-06 DIAGNOSIS — F900 Attention-deficit hyperactivity disorder, predominantly inattentive type: Secondary | ICD-10-CM | POA: Diagnosis not present

## 2021-10-06 DIAGNOSIS — F4010 Social phobia, unspecified: Secondary | ICD-10-CM | POA: Diagnosis not present

## 2021-10-06 DIAGNOSIS — F319 Bipolar disorder, unspecified: Secondary | ICD-10-CM

## 2021-10-12 ENCOUNTER — Encounter (HOSPITAL_COMMUNITY): Payer: Self-pay | Admitting: Licensed Clinical Social Worker

## 2021-10-12 NOTE — Progress Notes (Signed)
Virtual Visit via Video Note  I connected with Dawn Crosby on 10/12/21 at  1:30 PM EDT by a video enabled telemedicine application and verified that I am speaking with the correct person using two identifiers.  Location: Patient: home Provider: home office   I discussed the limitations of evaluation and management by telemedicine and the availability of in person appointments. The patient expressed understanding and agreed to proceed.    I discussed the assessment and treatment plan with the patient. The patient was provided an opportunity to ask questions and all were answered. The patient agreed with the plan and demonstrated an understanding of the instructions.   The patient was advised to call back or seek an in-person evaluation if the symptoms worsen or if the condition fails to improve as anticipated.  I provided 55 minutes of non-face-to-face time during this encounter.   [name], LCSW   THERAPIST PROGRESS NOTE  Session Time: 1:30pm-2:25pm  Participation Level: Active  Behavioral Response: NeatAlertEuthymic  Type of Therapy: Individual Therapy  Treatment Goals addressed: "coping with stress, transitions, social anxiety". Dawn Crosby will improve her anxiety 5 out of 7 days as evidenced by reduction of sxs, ability to use coping skills, and increased confidence in herself and her decisions.   ProgressTowards Goals: Progressing  Interventions: CBT  Summary: Dawn Crosby is a 21 y.o. female who presents with Bipolar I disorder, Social Anxiety, and ADHD inattentive.   Suicidal/Homicidal: Nowithout intent/plan  Therapist Response: Dawn Crosby engaged well in individual telehealth session with clinician. Clinician discussed interactions, moods, and thought processes since last session. Dawn Crosby provided updates and processed family hx of mental illness. Clinician discussed the challenges of other family members in coping with mental illness, as well as Dawn Crosby's own  experiences with the beginning stages of Bipolar Disorder. Clinician discussed coping skills that can assist with mood, or pre-empt anxiety. Clinician also identified the ongoing challenges in completing the task of clearing out her room and starting fresh. Clinician discussed relationship with boyfriend and noted positive interactions and a lot of support.   Plan: Return again in 2 weeks.  Diagnosis: Bipolar I disorder (HCC)  Social anxiety disorder  ADHD (attention deficit hyperactivity disorder), inattentive type  Collaboration of Care: Other none required in this session  Patient/Guardian was advised Release of Information must be obtained prior to any record release in order to collaborate their care with an outside provider. Patient/Guardian was advised if they have not already done so to contact the registration department to sign all necessary forms in order for [place] to release information regarding their care.   Consent: Patient/Guardian gives verbal consent for treatment and assignment of benefits for services provided during this visit. Patient/Guardian expressed understanding and agreed to proceed.   Chryl Heck [place], LCSW 10/12/2021

## 2021-10-14 ENCOUNTER — Other Ambulatory Visit (HOSPITAL_COMMUNITY): Payer: Self-pay

## 2021-10-20 ENCOUNTER — Encounter: Payer: Self-pay | Admitting: Adult Health

## 2021-10-20 ENCOUNTER — Other Ambulatory Visit (HOSPITAL_COMMUNITY): Payer: Self-pay

## 2021-10-20 ENCOUNTER — Ambulatory Visit (INDEPENDENT_AMBULATORY_CARE_PROVIDER_SITE_OTHER): Payer: No Typology Code available for payment source | Admitting: Adult Health

## 2021-10-20 DIAGNOSIS — F330 Major depressive disorder, recurrent, mild: Secondary | ICD-10-CM | POA: Diagnosis not present

## 2021-10-20 DIAGNOSIS — F319 Bipolar disorder, unspecified: Secondary | ICD-10-CM | POA: Diagnosis not present

## 2021-10-20 DIAGNOSIS — F900 Attention-deficit hyperactivity disorder, predominantly inattentive type: Secondary | ICD-10-CM

## 2021-10-20 DIAGNOSIS — F4010 Social phobia, unspecified: Secondary | ICD-10-CM | POA: Diagnosis not present

## 2021-10-20 MED ORDER — LAMOTRIGINE 200 MG PO TABS
ORAL_TABLET | Freq: Every day | ORAL | 3 refills | Status: DC
  Filled 2021-10-20: qty 90, 90d supply, fill #0
  Filled 2022-01-19: qty 90, 90d supply, fill #1
  Filled 2022-04-20 – 2022-05-28 (×2): qty 90, 90d supply, fill #2

## 2021-10-20 MED ORDER — BUPROPION HCL ER (XL) 300 MG PO TB24
ORAL_TABLET | Freq: Every day | ORAL | 3 refills | Status: DC
  Filled 2021-10-20: qty 90, fill #0
  Filled 2022-01-12: qty 90, 90d supply, fill #0
  Filled 2022-04-20 – 2022-05-04 (×2): qty 90, 90d supply, fill #1

## 2021-10-20 MED ORDER — AMPHETAMINE-DEXTROAMPHETAMINE 20 MG PO TABS: 20.0000 mg | ORAL_TABLET | Freq: Two times a day (BID) | ORAL | 0 refills | Status: DC

## 2021-10-20 MED ORDER — BUSPIRONE HCL 10 MG PO TABS
ORAL_TABLET | ORAL | 3 refills | Status: DC
  Filled 2021-10-20: qty 180, 90d supply, fill #0
  Filled 2022-01-19: qty 180, 90d supply, fill #1
  Filled 2022-05-11: qty 180, 90d supply, fill #2

## 2021-10-20 MED ORDER — AMPHETAMINE-DEXTROAMPHETAMINE 20 MG PO TABS
ORAL_TABLET | Freq: Two times a day (BID) | ORAL | 0 refills | Status: DC
  Filled 2022-01-05: qty 60, 30d supply, fill #0

## 2021-10-20 MED ORDER — AMPHETAMINE-DEXTROAMPHETAMINE 20 MG PO TABS
ORAL_TABLET | ORAL | 0 refills | Status: DC
  Filled 2021-10-20: qty 60, 30d supply, fill #0

## 2021-10-20 NOTE — Progress Notes (Signed)
DELAPHINE DALMAU [id number] 10-Dec-2000 21 y.o.  Subjective:   Patient ID:  Dawn Crosby is a 21 y.o. (DOB May 09, 2001) female.  Chief Complaint: No chief complaint on file.   HPI Dawn Crosby presents to the office today for follow-up of Bipolar disorder, ADHD, SAD, MDD, Social phobia.  Describes mood today as "ok". Pleasant. Tearful at times. Mood symptoms - reports some depression and anxiety. Feels irritable at times. Reports some highs and lows - usually when coming back from her boyfriend house. Feels like his house is less chaotic house versus her house. Feels like current medications are working well. Stable interest and motivation. Taking medications as prescribed.  Energy levels varies. Active, does not have a regular exercise routine.  Enjoys some usual interests and activities. Dating. Has a boyfriend. Living at home. Spending time with family. Appetite adequate. Weight stable - 270 pounds. Sleeps well most nights. Averages 8 or more hours. Focus and concentration stable. Completing tasks. Managing aspects of household. Unemployed.  Denies SI or HI.  Denies AH or VH. Seeing therapist.  Self harm x 400 days.  Previous medication trials: Prozac - rash.   [place] ED from 08/31/2020 in [facility] [place] [facility]        Review of Systems:  Review of Systems  Musculoskeletal:  Negative for gait problem.  Neurological:  Negative for tremors.  Psychiatric/Behavioral:         Please refer to HPI   Medications: I have reviewed the patient's current medications.  Current Outpatient Medications  Medication Sig Dispense Refill   [START ON 12/15/2021] amphetamine-dextroamphetamine (ADDERALL) 20 MG tablet Take 1 tablet (20 mg total) by mouth 2 (two) times daily. 60 tablet 0   amphetamine-dextroamphetamine (ADDERALL) 20 MG tablet TAKE 1 TABLET BY MOUTH 2 TIMES DAILY FOR ADD 60 tablet 0   [START ON 11/17/2021]  amphetamine-dextroamphetamine (ADDERALL) 20 MG tablet TAKE 1 TABLET BY MOUTH 2 TIMES DAILY 60 tablet 0   buPROPion (WELLBUTRIN XL) 300 MG 24 hr tablet TAKE 1 TABLET BY MOUTH ONCE DAILY 90 tablet 3   busPIRone (BUSPAR) 10 MG tablet Take one tablet twice daily. 180 tablet 3   cetirizine (ZYRTEC) 10 MG tablet Take 10 mg by mouth at bedtime.     clonazePAM (KLONOPIN) 0.5 MG tablet Take 1 tablet by mouth once a day as needed for anxiety or panic 30 tablet 3   co-enzyme Q-10 30 MG capsule Take 30 mg by mouth daily.     diphenhydrAMINE (BENADRYL) 25 mg capsule Take 1 each (25 mg total) by mouth every 6 (six) hours as needed for allergies for up to 5 days. 12 capsule 0   docusate sodium (COLACE) 100 MG capsule Take 100 mg by mouth at bedtime.     HYDROcodone-acetaminophen (NORCO/VICODIN) 5-325 MG tablet Take 1 tablet by mouth every 6 hours as needed for breakthrough pain (Patient not taking: Reported on 07/29/2021) 12 tablet 0   ibuprofen (ADVIL) 800 MG tablet Take 1 tablet by mouth every 8 hours with food 30 tablet 0   lamoTRIgine (LAMICTAL) 200 MG tablet TAKE 1 TABLET BY MOUTH ONCE A DAY 90 tablet 3   melatonin 5 MG TABS Take 5 mg by mouth at bedtime.     Multiple Vitamin (MULTIVITAMIN) capsule Take 1 capsule by mouth daily.     Omega-3 1000 MG CAPS Take 1 capsule by mouth at bedtime.     QUEtiapine (SEROQUEL) 100 MG tablet  Take 1/2 to 1 tablet by mouth as needed for sleep 30 tablet 3   No current facility-administered medications for this visit.    Medication Side Effects: None  Allergies:  Allergies  Allergen Reactions   Bactrim [Sulfamethoxazole-Trimethoprim] Hives    Possible hives; pt was on bactrim and pcn, unsure which caused hives   Clindamycin/Lincomycin Hives   Kiwi Extract Other (See Comments)    Tongue tingling  Other reaction(s): tongue tingle   Penicillins Hives    Possible hives; pt was on bactrim and pcn, unsure which caused hives   Pineapple Other (See Comments)    Tongue  allergy  Other reaction(s): tongue tingle   Prozac [Fluoxetine Hcl] Hives   Adhesive [Tape] Rash   Clindamycin Rash   Fluoxetine Rash    Other reaction(s): hives, itching    Past Medical History:  Diagnosis Date   ADHD    Anxiety    Asthma    as child    Bipolar disorder ([place])    Depression     Past Medical History, Surgical history, Social history, and Family history were reviewed and updated as appropriate.   Please see review of systems for further details on the patient's review from today.   Objective:   Physical Exam:  There were no vitals taken for this visit.  Physical Exam Constitutional:      General: She is not in acute distress. Musculoskeletal:        General: No deformity.  Neurological:     Mental Status: She is alert and oriented to person, place, and time.     Coordination: Coordination normal.  Psychiatric:        Attention and Perception: Attention and perception normal. She does not perceive auditory or visual hallucinations.        Mood and Affect: Mood normal. Mood is not anxious or depressed. Affect is not labile, blunt, angry or inappropriate.        Speech: Speech normal.        Behavior: Behavior normal.        Thought Content: Thought content normal. Thought content is not paranoid or delusional. Thought content does not include homicidal or suicidal ideation. Thought content does not include homicidal or suicidal plan.        Cognition and Memory: Cognition and memory normal.        Judgment: Judgment normal.     Comments: Insight intact    Lab Review:     Component Value Date/Time   NA 139 04/01/2021 1143   K 3.9 04/01/2021 1143   CL 104 04/01/2021 1143   CO2 28 04/01/2021 1143   GLUCOSE 87 04/01/2021 1143   BUN 12 04/01/2021 1143   CREATININE 0.91 04/01/2021 1143   CREATININE 0.69 12/31/2015 0001   CALCIUM 9.6 04/01/2021 1143   PROT 7.1 04/01/2021 1143   ALBUMIN 4.4 04/01/2021 1143   AST 14 04/01/2021 1143   ALT 13  04/01/2021 1143   ALKPHOS 70 04/01/2021 1143   BILITOT 0.3 04/01/2021 1143       Component Value Date/Time   WBC 7.8 04/01/2021 1143   RBC 4.41 04/01/2021 1143   HGB 13.0 04/01/2021 1143   HCT 38.3 04/01/2021 1143   PLT 335.0 04/01/2021 1143   MCV 86.9 04/01/2021 1143   MCHC 34.1 04/01/2021 1143   RDW 12.5 04/01/2021 1143    No results found for: POCLITH, LITHIUM   No results found for: PHENYTOIN, PHENOBARB, VALPROATE, CBMZ   .res Assessment:  Plan:    Plan:  PDMP reviewed  Adderall 20mg  BID Buspar 10mg  BID Lamictal 200mg  daily Wellbutrin XL 300mg  in the am  None needed - Seroquel 100mg  - 1/2 to one tablet as needed None needed - Clonazepam 0.5mg  as needed for anxiety  3 months  Discussed potential benefits, risk, and side effects of benzodiazepines to include potential risk of tolerance and dependence, as well as possible drowsiness.  Advised patient not to drive if experiencing drowsiness and to take lowest possible effective dose to minimize risk of dependence and tolerance.   Discussed potential benefits, risks, and side effects of stimulants with patient to include increased heart rate, palpitations, insomnia, increased anxiety, increased irritability, or decreased appetite.  Instructed patient to contact office if experiencing any significant tolerability issues.   Discussed potential metabolic side effects associated with atypical antipsychotics, as well as potential risk for movement side effects. Advised pt to contact office if movement side effects occur.    Counseled patient regarding potential benefits, risks, and side effects of Lamictal to include potential risk of Stevens-Johnson syndrome. Advised patient to stop taking Lamictal and contact office immediately if rash develops and to seek urgent medical attention if rash is severe and/or spreading quickly.   Please see After Visit Summary for patient specific instructions.  Future Appointments  Date Time  Provider Ruth  10/27/2021 12:30 PM Schlosberg, Jobe Marker, LCSW [id number] None    No orders of the defined types were placed in this encounter.   -------------------------------

## 2021-10-27 ENCOUNTER — Ambulatory Visit (HOSPITAL_COMMUNITY): Payer: No Typology Code available for payment source | Admitting: Licensed Clinical Social Worker

## 2021-11-09 ENCOUNTER — Other Ambulatory Visit (HOSPITAL_COMMUNITY): Payer: Self-pay

## 2021-11-11 ENCOUNTER — Ambulatory Visit (HOSPITAL_COMMUNITY): Payer: No Typology Code available for payment source | Admitting: Licensed Clinical Social Worker

## 2021-11-23 ENCOUNTER — Other Ambulatory Visit: Payer: Self-pay | Admitting: Adult Health

## 2021-11-23 ENCOUNTER — Other Ambulatory Visit (HOSPITAL_COMMUNITY): Payer: Self-pay

## 2021-11-23 DIAGNOSIS — F900 Attention-deficit hyperactivity disorder, predominantly inattentive type: Secondary | ICD-10-CM

## 2021-11-23 MED ORDER — AMPHETAMINE-DEXTROAMPHETAMINE 20 MG PO TABS
ORAL_TABLET | ORAL | 0 refills | Status: DC
  Filled 2021-11-23: qty 60, 30d supply, fill #0

## 2021-11-24 ENCOUNTER — Ambulatory Visit (INDEPENDENT_AMBULATORY_CARE_PROVIDER_SITE_OTHER): Payer: No Typology Code available for payment source | Admitting: Licensed Clinical Social Worker

## 2021-11-24 ENCOUNTER — Encounter (HOSPITAL_COMMUNITY): Payer: Self-pay | Admitting: Licensed Clinical Social Worker

## 2021-11-24 DIAGNOSIS — F4010 Social phobia, unspecified: Secondary | ICD-10-CM

## 2021-11-24 DIAGNOSIS — F319 Bipolar disorder, unspecified: Secondary | ICD-10-CM

## 2021-11-24 DIAGNOSIS — F900 Attention-deficit hyperactivity disorder, predominantly inattentive type: Secondary | ICD-10-CM

## 2021-11-24 NOTE — Progress Notes (Signed)
Virtual Visit via Video Note  I connected with Dawn Crosby on 11/24/21 at  1:30 PM EDT by a video enabled telemedicine application and verified that I am speaking with the correct person using two identifiers.  Location: Patient: home Provider: home office   I discussed the limitations of evaluation and management by telemedicine and the availability of in person appointments. The patient expressed understanding and agreed to proceed.    I discussed the assessment and treatment plan with the patient. The patient was provided an opportunity to ask questions and all were answered. The patient agreed with the plan and demonstrated an understanding of the instructions.   The patient was advised to call back or seek an in-person evaluation if the symptoms worsen or if the condition fails to improve as anticipated.  I provided 55 minutes of non-face-to-face time during this encounter.   [name], LCSW   THERAPIST PROGRESS NOTE  Session Time: 1:30pm-2:25pm  Participation Level: Active  Behavioral Response: NeatAlertIrritable  Type of Therapy: Individual Therapy  Treatment Goals addressed: "coping with stress, transitions, social anxiety". Dawn Crosby will improve her anxiety 5 out of 7 days as evidenced by reduction of sxs, ability to use coping skills, and increased confidence in herself and her decisions.   ProgressTowards Goals: Progressing  Interventions: CBT  Summary: Dawn Crosby is a 21 y.o. female who presents with Bipolar I disorder, social anxiety, and ADHD inattentive.   Suicidal/Homicidal: Nowithout intent/plan  Therapist Response: Dawn Crosby engaged well in session with clinician. Clinician utilized CBT to process thoughts, feelings, and behaviors. Dawn Crosby shared that she has been struggling emotionally with some ups and downs, and focused particularly on an incident that occurred with boyfriend's family on a vacation. Clinician processed the incident and  discussed coping skills. Clinician encouraged Dawn Crosby to write a script that would assist her in setting boundaries with boyfriend's sister, who was very nasty to [employer]. Clinician also identified the importance of using this script with others in her support system to ensure her needs and boundaries are being met.   Plan: Return again in 4 weeks.  Diagnosis: Bipolar I disorder ([place])  Social anxiety disorder  ADHD (attention deficit hyperactivity disorder), inattentive type  Collaboration of Care: Other none required  Patient/Guardian was advised Release of Information must be obtained prior to any record release in order to collaborate their care with an outside provider. Patient/Guardian was advised if they have not already done so to contact the registration department to sign all necessary forms in order for [place] to release information regarding their care.   Consent: Patient/Guardian gives verbal consent for treatment and assignment of benefits for services provided during this visit. Patient/Guardian expressed understanding and agreed to proceed.   Jobe Marker [place], LCSW 11/24/2021

## 2021-12-22 IMAGING — US US ABDOMEN LIMITED
1 series · 14 of 25 positions shown · non-contrast
Comparison: None.

CLINICAL DATA: Right upper quadrant pain with nausea

EXAM:
ULTRASOUND ABDOMEN LIMITED RIGHT UPPER QUADRANT

[Series 1: us abdomen limited · 0.14mm/px · 14 of 45 slices shown]
[im 1/45]
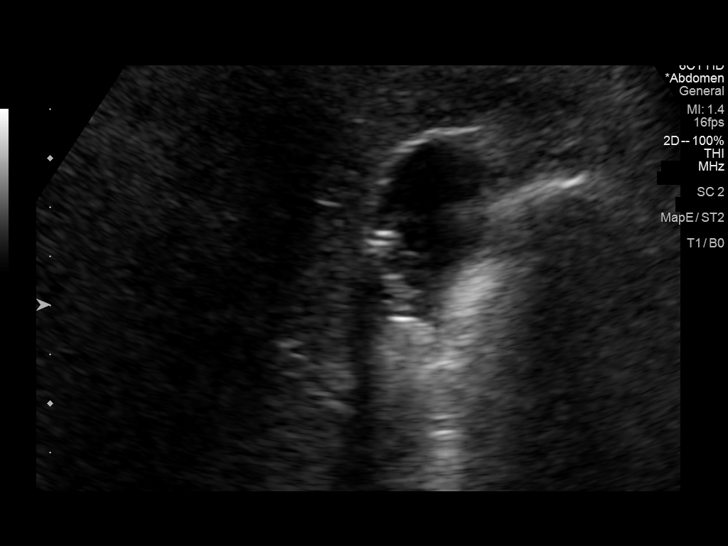
[im 4/45]
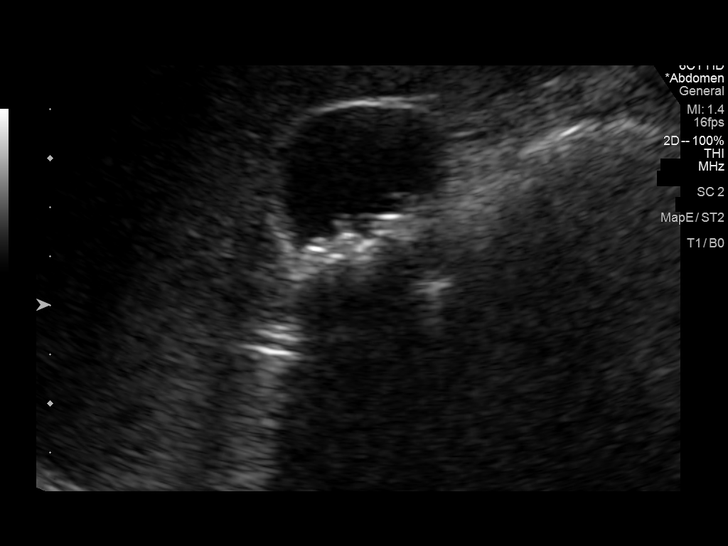
[im 8/45]
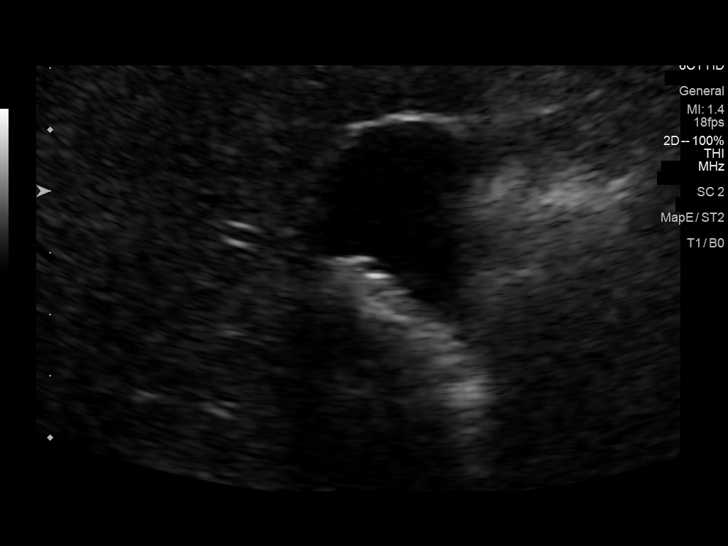
[im 12/45]
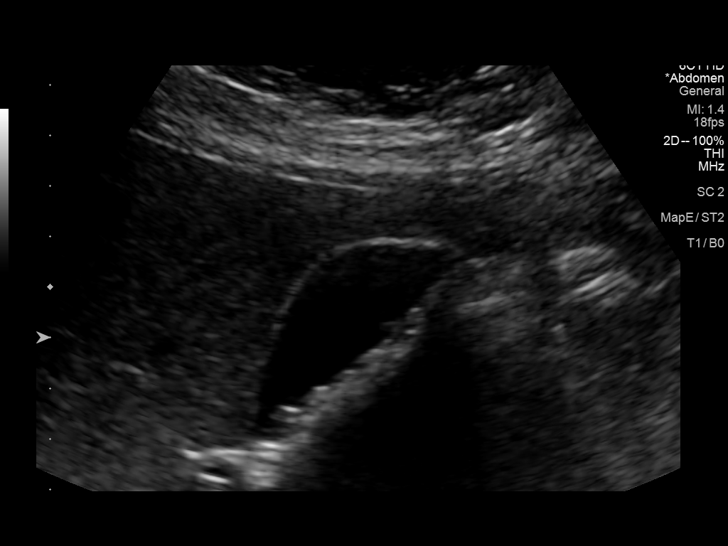
[im 15/45]
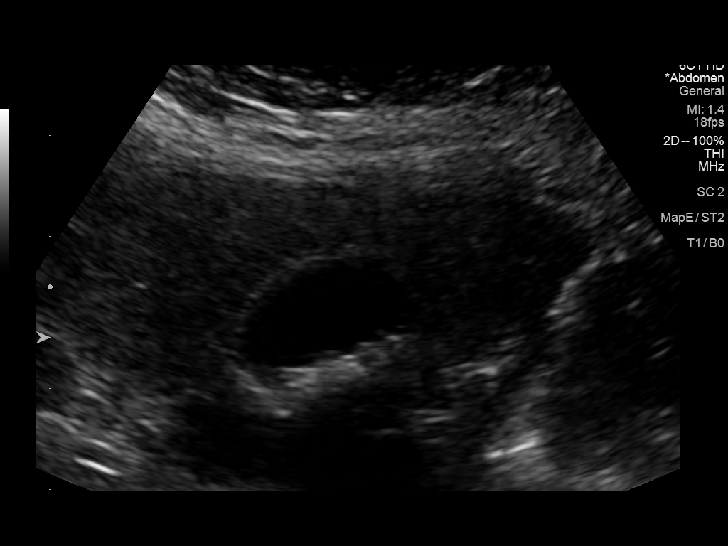
[im 17/45]
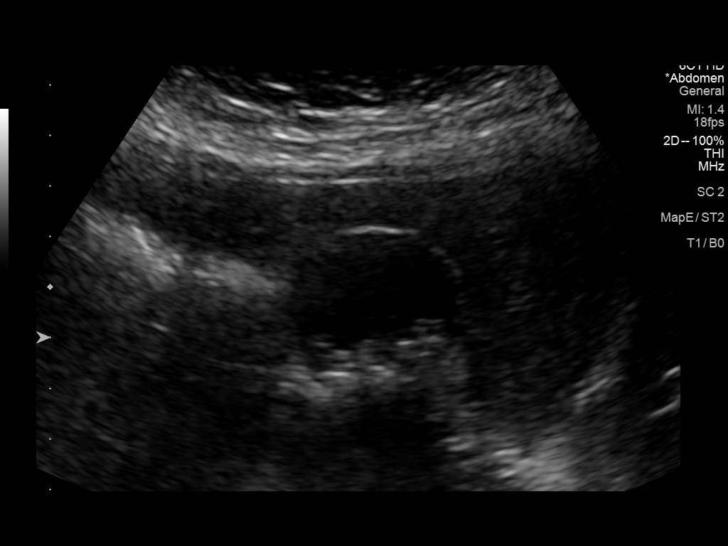
[im 21/45]
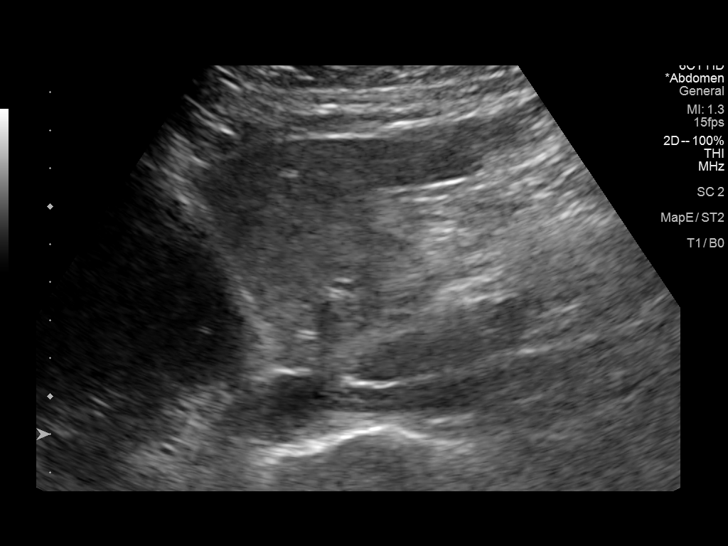
[im 24/45]
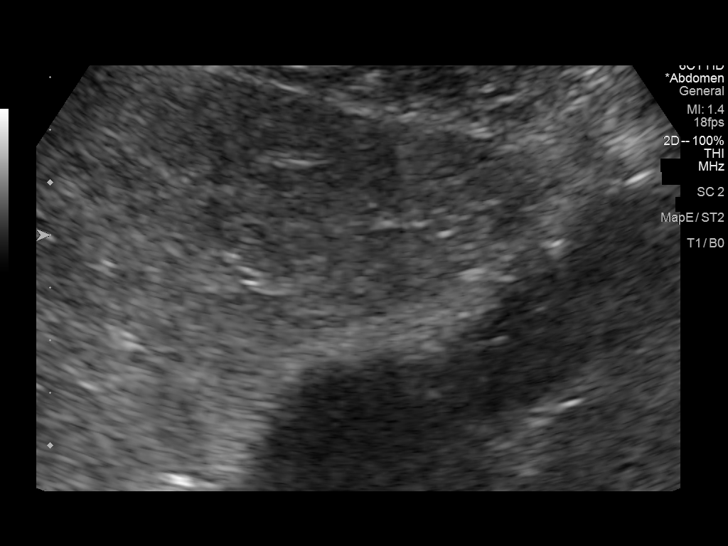
[im 28/45]
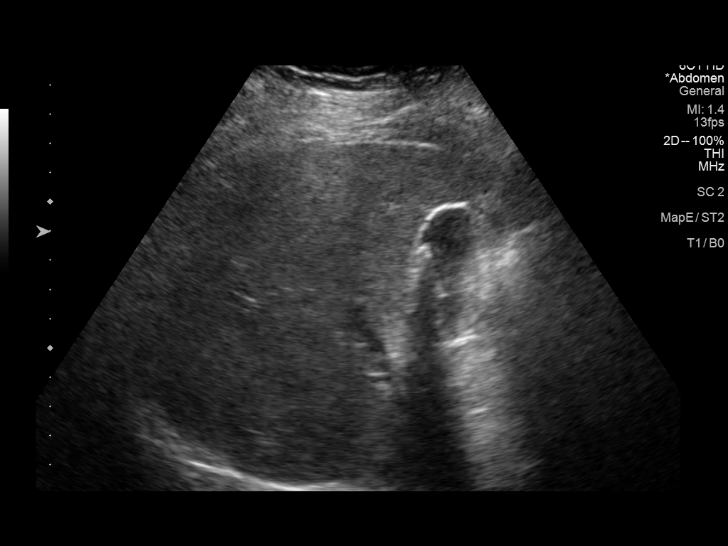
[im 30/45]
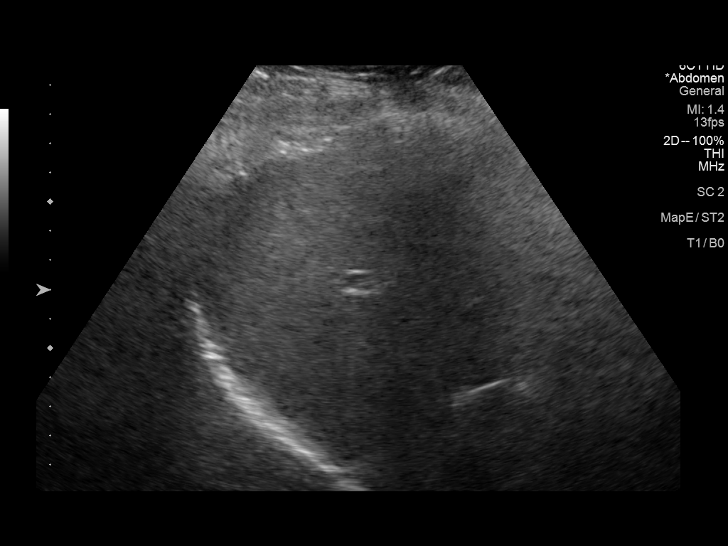
[im 34/45]
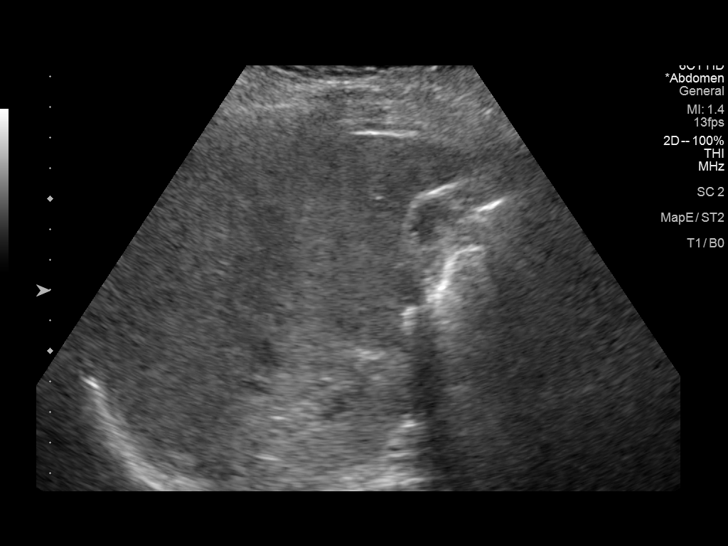
[im 37/45]
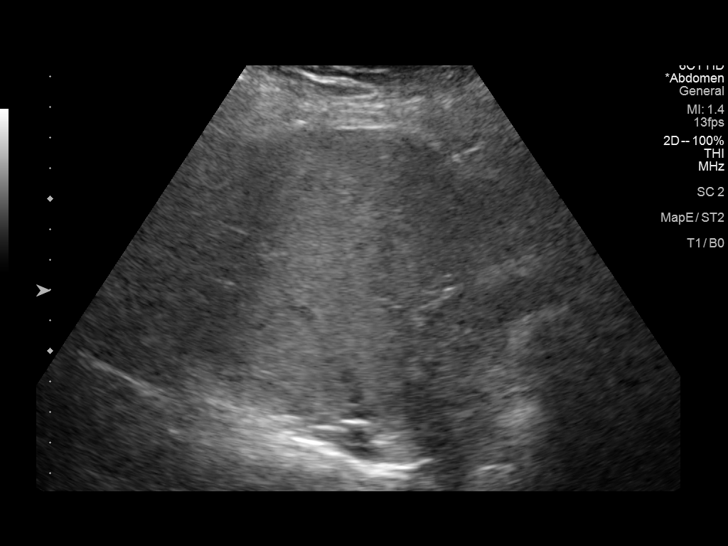
[im 41/45]
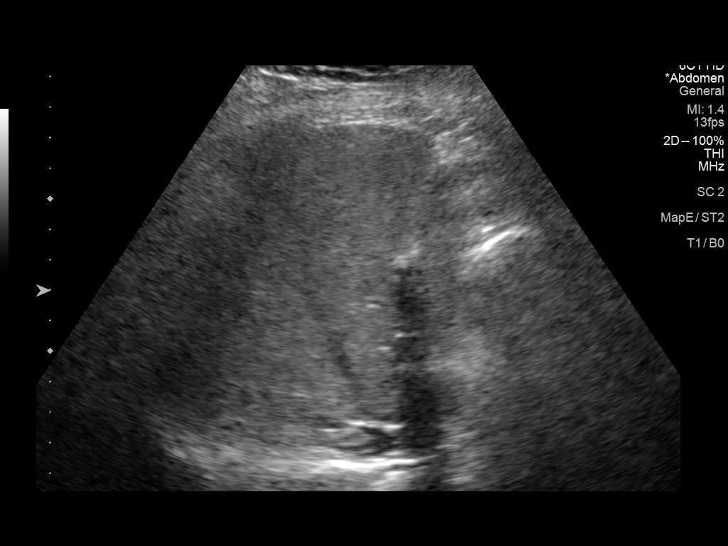
[im 45/45]
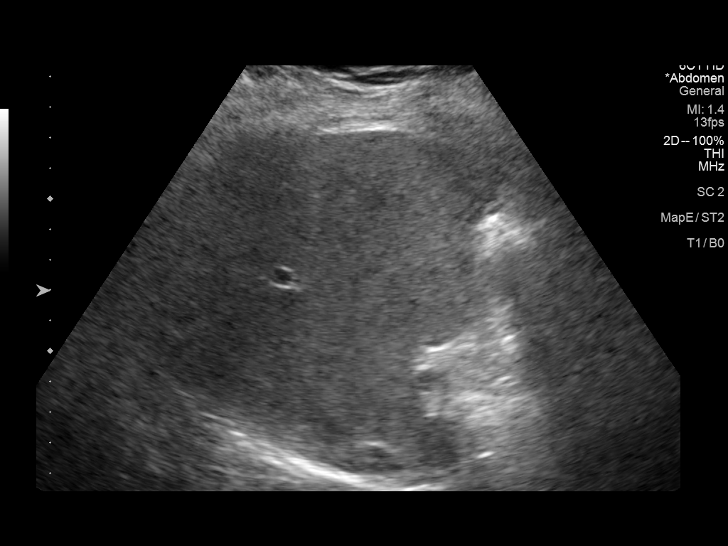

[14 of 25 positions shown; findings below may reference images not displayed]

FINDINGS: Gallbladder:

Numerous shadowing stones measuring up to 6 mm. Normal wall
thickness. Negative sonographic Murphy.

Common bile duct:

Diameter: 5.6 mm

Liver:

Increased hepatic echogenicity. No focal hepatic abnormality portal
vein is patent on color Doppler imaging with normal direction of
blood flow towards the liver.

Other: None.
IMPRESSION: 1. Cholelithiasis without sonographic evidence for acute
cholecystitis
2. Echogenic liver consistent with steatosis

## 2021-12-23 ENCOUNTER — Ambulatory Visit (HOSPITAL_COMMUNITY): Payer: No Typology Code available for payment source | Admitting: Licensed Clinical Social Worker

## 2022-01-06 ENCOUNTER — Other Ambulatory Visit (HOSPITAL_COMMUNITY): Payer: Self-pay

## 2022-01-13 ENCOUNTER — Other Ambulatory Visit (HOSPITAL_COMMUNITY): Payer: Self-pay

## 2022-01-13 ENCOUNTER — Ambulatory Visit (HOSPITAL_COMMUNITY): Payer: No Typology Code available for payment source | Admitting: Licensed Clinical Social Worker

## 2022-01-19 ENCOUNTER — Ambulatory Visit: Payer: No Typology Code available for payment source | Admitting: Adult Health

## 2022-01-19 NOTE — Progress Notes (Signed)
Patient r/s due to power outage. 

## 2022-01-20 ENCOUNTER — Other Ambulatory Visit (HOSPITAL_COMMUNITY): Payer: Self-pay

## 2022-02-01 ENCOUNTER — Other Ambulatory Visit (HOSPITAL_COMMUNITY): Payer: Self-pay

## 2022-02-01 ENCOUNTER — Encounter: Payer: Self-pay | Admitting: Adult Health

## 2022-02-01 ENCOUNTER — Ambulatory Visit (INDEPENDENT_AMBULATORY_CARE_PROVIDER_SITE_OTHER): Payer: No Typology Code available for payment source | Admitting: Adult Health

## 2022-02-01 DIAGNOSIS — F330 Major depressive disorder, recurrent, mild: Secondary | ICD-10-CM

## 2022-02-01 DIAGNOSIS — F319 Bipolar disorder, unspecified: Secondary | ICD-10-CM

## 2022-02-01 DIAGNOSIS — F4010 Social phobia, unspecified: Secondary | ICD-10-CM

## 2022-02-01 DIAGNOSIS — F900 Attention-deficit hyperactivity disorder, predominantly inattentive type: Secondary | ICD-10-CM | POA: Diagnosis not present

## 2022-02-01 MED ORDER — AMPHETAMINE-DEXTROAMPHETAMINE 20 MG PO TABS
ORAL_TABLET | Freq: Two times a day (BID) | ORAL | 0 refills | Status: DC
  Filled 2022-02-01 – 2022-02-03 (×2): qty 60, 30d supply, fill #0

## 2022-02-01 MED ORDER — AMPHETAMINE-DEXTROAMPHETAMINE 20 MG PO TABS: ORAL_TABLET | ORAL | 0 refills | Status: DC

## 2022-02-01 MED ORDER — CLONAZEPAM 0.5 MG PO TABS
0.5000 mg | ORAL_TABLET | Freq: Every day | ORAL | 3 refills | Status: DC | PRN
  Filled 2022-02-01: qty 30, 30d supply, fill #0

## 2022-02-01 MED ORDER — QUETIAPINE FUMARATE 100 MG PO TABS
50.0000 mg | ORAL_TABLET | ORAL | 5 refills | Status: DC | PRN
  Filled 2022-02-01: qty 30, 30d supply, fill #0

## 2022-02-01 MED ORDER — AMPHETAMINE-DEXTROAMPHETAMINE 20 MG PO TABS
20.0000 mg | ORAL_TABLET | Freq: Two times a day (BID) | ORAL | 0 refills | Status: DC
  Filled 2022-03-23: qty 60, 30d supply, fill #0

## 2022-02-01 NOTE — Progress Notes (Signed)
KRISTIAN HAZZARD [id number] 09-17-2000 21 y.o.  Subjective:   Patient ID:  Dawn Crosby is a 21 y.o. (DOB 20-Mar-2001) female.  Chief Complaint: No chief complaint on file.   HPI Dawn Crosby presents to the office today for follow-up of Bipolar disorder, ADHD, SAD, MDD, Social phobia.  Describes mood today as "ok". Pleasant. Tearful at times. Mood symptoms - reports some depression, anxiety and irritability. Reports some worry and rumination. Mood is consistent. Stating "I'm doing alright". Feels like current medications are working well. Stable interest and motivation. Taking medications as prescribed.  Energy levels pretty good. Active, does not have a regular exercise routine. Walking.  Enjoys some usual interests and activities. Dating. Has a boyfriend. Living at home. Spending time with family. Appetite adequate. Weight stable - 270 pounds. Sleeps well most nights. Averages 8 or more hours. Focus and concentration stable. Completing tasks. Managing aspects of household. Unemployed.  Denies SI or HI.  Denies AH or VH. Self harm x 500 days. Seeing therapist.   Previous medication trials: Prozac - rash.   Flowsheet Row ED from 08/31/2020 in [facility] RISK CATEGORY No Risk        Review of Systems:  Review of Systems  Musculoskeletal:  Negative for gait problem.  Neurological:  Negative for tremors.  Psychiatric/Behavioral:         Please refer to HPI    Medications: I have reviewed the patient's current medications.  Current Outpatient Medications  Medication Sig Dispense Refill   amphetamine-dextroamphetamine (ADDERALL) 20 MG tablet TAKE 1 TABLET BY MOUTH 2 TIMES DAILY 60 tablet 0   amphetamine-dextroamphetamine (ADDERALL) 20 MG tablet Take 1 tablet (20 mg total) by mouth 2 (two) times daily. 60 tablet 0   amphetamine-dextroamphetamine (ADDERALL) 20 MG tablet TAKE 1 TABLET BY MOUTH 2 TIMES DAILY FOR ADD 60 tablet 0    buPROPion (WELLBUTRIN XL) 300 MG 24 hr tablet TAKE 1 TABLET BY MOUTH ONCE DAILY 90 tablet 3   busPIRone (BUSPAR) 10 MG tablet Take one tablet by mouth twice daily. 180 tablet 3   cetirizine (ZYRTEC) 10 MG tablet Take 10 mg by mouth at bedtime.     clonazePAM (KLONOPIN) 0.5 MG tablet Take 1 tablet by mouth once a day as needed for anxiety or panic 30 tablet 3   co-enzyme Q-10 30 MG capsule Take 30 mg by mouth daily.     diphenhydrAMINE (BENADRYL) 25 mg capsule Take 1 each (25 mg total) by mouth every 6 (six) hours as needed for allergies for up to 5 days. 12 capsule 0   docusate sodium (COLACE) 100 MG capsule Take 100 mg by mouth at bedtime.     HYDROcodone-acetaminophen (NORCO/VICODIN) 5-325 MG tablet Take 1 tablet by mouth every 6 hours as needed for breakthrough pain (Patient not taking: Reported on 07/29/2021) 12 tablet 0   ibuprofen (ADVIL) 800 MG tablet Take 1 tablet by mouth every 8 hours with food 30 tablet 0   lamoTRIgine (LAMICTAL) 200 MG tablet TAKE 1 TABLET BY MOUTH ONCE A DAY 90 tablet 3   melatonin 5 MG TABS Take 5 mg by mouth at bedtime.     Multiple Vitamin (MULTIVITAMIN) capsule Take 1 capsule by mouth daily.     Omega-3 1000 MG CAPS Take 1 capsule by mouth at bedtime.     QUEtiapine (SEROQUEL) 100 MG tablet Take 1/2 to 1 tablet by mouth as needed for sleep 30 tablet 3   No  current facility-administered medications for this visit.    Medication Side Effects: None  Allergies:  Allergies  Allergen Reactions   Bactrim [Sulfamethoxazole-Trimethoprim] Hives    Possible hives; pt was on bactrim and pcn, unsure which caused hives   Clindamycin/Lincomycin Hives   Kiwi Extract Other (See Comments)    Tongue tingling  Other reaction(s): tongue tingle   Penicillins Hives    Possible hives; pt was on bactrim and pcn, unsure which caused hives   Pineapple Other (See Comments)    Tongue allergy  Other reaction(s): tongue tingle   Prozac [Fluoxetine Hcl] Hives   Adhesive [Tape]  Rash   Clindamycin Rash   Fluoxetine Rash    Other reaction(s): hives, itching    Past Medical History:  Diagnosis Date   ADHD    Anxiety    Asthma    as child    Bipolar disorder ([place])    Depression     Past Medical History, Surgical history, Social history, and Family history were reviewed and updated as appropriate.   Please see review of systems for further details on the patient's review from today.   Objective:   Physical Exam:  There were no vitals taken for this visit.  Physical Exam Constitutional:      General: She is not in acute distress. Musculoskeletal:        General: No deformity.  Neurological:     Mental Status: She is alert and oriented to person, place, and time.     Coordination: Coordination normal.  Psychiatric:        Attention and Perception: Attention and perception normal. She does not perceive auditory or visual hallucinations.        Mood and Affect: Mood normal. Mood is not anxious or depressed. Affect is not labile, blunt, angry or inappropriate.        Speech: Speech normal.        Behavior: Behavior normal.        Thought Content: Thought content normal. Thought content is not paranoid or delusional. Thought content does not include homicidal or suicidal ideation. Thought content does not include homicidal or suicidal plan.        Cognition and Memory: Cognition and memory normal.        Judgment: Judgment normal.     Comments: Insight intact     Lab Review:     Component Value Date/Time   NA 139 04/01/2021 1143   K 3.9 04/01/2021 1143   CL 104 04/01/2021 1143   CO2 28 04/01/2021 1143   GLUCOSE 87 04/01/2021 1143   BUN 12 04/01/2021 1143   CREATININE 0.91 04/01/2021 1143   CREATININE 0.69 12/31/2015 0001   CALCIUM 9.6 04/01/2021 1143   PROT 7.1 04/01/2021 1143   ALBUMIN 4.4 04/01/2021 1143   AST 14 04/01/2021 1143   ALT 13 04/01/2021 1143   ALKPHOS 70 04/01/2021 1143   BILITOT 0.3 04/01/2021 1143       Component Value  Date/Time   WBC 7.8 04/01/2021 1143   RBC 4.41 04/01/2021 1143   HGB 13.0 04/01/2021 1143   HCT 38.3 04/01/2021 1143   PLT 335.0 04/01/2021 1143   MCV 86.9 04/01/2021 1143   MCHC 34.1 04/01/2021 1143   RDW 12.5 04/01/2021 1143    No results found for: "POCLITH", "LITHIUM"   No results found for: "PHENYTOIN", "PHENOBARB", "VALPROATE", "CBMZ"   .res Assessment: Plan:     Plan:  PDMP reviewed  Adderall 20mg  BID Buspar 10mg  BID  Lamictal 200mg  daily Wellbutrin XL 300mg  in the am  Seroquel 100mg  - 1/2 to one tablet as needed Clonazepam 0.5mg  as needed for anxiety  3 months  Discussed potential benefits, risk, and side effects of benzodiazepines to include potential risk of tolerance and dependence, as well as possible drowsiness.  Advised patient not to drive if experiencing drowsiness and to take lowest possible effective dose to minimize risk of dependence and tolerance.   Discussed potential benefits, risks, and side effects of stimulants with patient to include increased heart rate, palpitations, insomnia, increased anxiety, increased irritability, or decreased appetite.  Instructed patient to contact office if experiencing any significant tolerability issues.   Discussed potential metabolic side effects associated with atypical antipsychotics, as well as potential risk for movement side effects. Advised pt to contact office if movement side effects occur.    Counseled patient regarding potential benefits, risks, and side effects of Lamictal to include potential risk of Stevens-Johnson syndrome. Advised patient to stop taking Lamictal and contact office immediately if rash develops and to seek urgent medical attention if rash is severe and/or spreading quickly.   Diagnoses and all orders for this visit:  Social anxiety disorder  ADHD (attention deficit hyperactivity disorder), inattentive type     Please see After Visit Summary for patient specific  instructions.  Future Appointments  Date Time Provider Department Center  02/02/2022 11:00 AM , , LCSW [facility] None  02/17/2022  1:30 PM Maia Plan, Chryl Heck, LCSW [facility] None    No orders of the defined types were placed in this encounter.   -------------------------------

## 2022-02-02 ENCOUNTER — Other Ambulatory Visit (HOSPITAL_COMMUNITY): Payer: Self-pay

## 2022-02-02 ENCOUNTER — Ambulatory Visit (INDEPENDENT_AMBULATORY_CARE_PROVIDER_SITE_OTHER): Payer: No Typology Code available for payment source | Admitting: Licensed Clinical Social Worker

## 2022-02-02 DIAGNOSIS — F319 Bipolar disorder, unspecified: Secondary | ICD-10-CM

## 2022-02-03 ENCOUNTER — Other Ambulatory Visit (HOSPITAL_COMMUNITY): Payer: Self-pay

## 2022-02-03 ENCOUNTER — Encounter (HOSPITAL_COMMUNITY): Payer: Self-pay | Admitting: Licensed Clinical Social Worker

## 2022-02-03 NOTE — Progress Notes (Signed)
Virtual Visit via Video Note  I connected with Dawn Crosby on 02/03/22 at 11:00 AM EDT by a video enabled telemedicine application and verified that I am speaking with the correct person using two identifiers.  Location: Patient: home Provider: home office   I discussed the limitations of evaluation and management by telemedicine and the availability of in person appointments. The patient expressed understanding and agreed to proceed.    I discussed the assessment and treatment plan with the patient. The patient was provided an opportunity to ask questions and all were answered. The patient agreed with the plan and demonstrated an understanding of the instructions.   The patient was advised to call back or seek an in-person evaluation if the symptoms worsen or if the condition fails to improve as anticipated.  I provided 55 minutes of non-face-to-face time during this encounter.   Dawn Curling, LCSW   THERAPIST PROGRESS NOTE  Session Time: 11:00am-11:55am  Participation Level: Active  Behavioral Response: NeatAlertDepressed  Type of Therapy: Individual Therapy  Treatment Goals addressed: "coping with stress, transitions, social anxiety". Dawn Crosby will improve her anxiety 5 out of 7 days as evidenced by reduction of sxs, ability to use coping skills, and increased confidence in herself and her decisions.     ProgressTowards Goals: Progressing  Interventions: CBT  Summary: Dawn Crosby is a 21 y.o. female who presents with Bipolar I Depressed.   Suicidal/Homicidal: Nowithout intent/plan  Therapist Response: Dawn Crosby engaged well in Therapist, sports with Pension scheme manager. Clinician utilized CBT to process thoughts, feelings, and behaviors. Clinician explored updates in relationships, activities, and future planning. Clinician identified that too much future planning and choices to be more adult have been very overwhelming, despite the fact that she is 69. Clinician  discussed the importance of learning as you go, as well as asking for help when she feels confused. Clinician identified the impact of anxiety on her ability to self-motivate. Clinician processed coping skills for anxiety and noted the importance of practicing coping skills daily.   Plan: Return again in 2 weeks.  Diagnosis: Bipolar I disorder ([place])  Collaboration of Care: Other reviewed recent note from provider  Patient/Guardian was advised Release of Information must be obtained prior to any record release in order to collaborate their care with an outside provider. Patient/Guardian was advised if they have not already done so to contact the registration department to sign all necessary forms in order for Dawn Crosby to release information regarding their care.   Consent: Patient/Guardian gives verbal consent for treatment and assignment of benefits for services provided during this visit. Patient/Guardian expressed understanding and agreed to proceed.   Dawn Marker [place], LCSW 02/03/2022

## 2022-02-17 ENCOUNTER — Ambulatory Visit (HOSPITAL_COMMUNITY): Payer: No Typology Code available for payment source | Admitting: Licensed Clinical Social Worker

## 2022-03-02 ENCOUNTER — Ambulatory Visit (INDEPENDENT_AMBULATORY_CARE_PROVIDER_SITE_OTHER): Payer: No Typology Code available for payment source | Admitting: Licensed Clinical Social Worker

## 2022-03-02 DIAGNOSIS — F319 Bipolar disorder, unspecified: Secondary | ICD-10-CM

## 2022-03-03 ENCOUNTER — Encounter (HOSPITAL_COMMUNITY): Payer: Self-pay | Admitting: Licensed Clinical Social Worker

## 2022-03-03 NOTE — Progress Notes (Signed)
Virtual Visit via Video Note  I connected with Dawn Crosby on 03/03/22 at 11:00 AM EDT by a video enabled telemedicine application and verified that I am speaking with the correct person using two identifiers.  Location: Patient: home Provider: home office   I discussed the limitations of evaluation and management by telemedicine and the availability of in person appointments. The patient expressed understanding and agreed to proceed.    I discussed the assessment and treatment plan with the patient. The patient was provided an opportunity to ask questions and all were answered. The patient agreed with the plan and demonstrated an understanding of the instructions.   The patient was advised to call back or seek an in-person evaluation if the symptoms worsen or if the condition fails to improve as anticipated.  I provided 45 minutes of non-face-to-face time during this encounter.   [name], LCSW   THERAPIST PROGRESS NOTE  Session Time: 11:00am-11:45am  Participation Level: Active  Behavioral Response: NAAlertEuthymic  Type of Therapy: Individual Therapy  Treatment Goals addressed:  "coping with stress, transitions, social anxiety". Dawn Crosby will improve her anxiety 5 out of 7 days as evidenced by reduction of sxs, ability to use coping skills, and increased confidence in herself and her decisions.     ProgressTowards Goals: Progressing  Interventions: CBT  Summary: Dawn Crosby is a 21 y.o. female who presents with Bipolar I depressed.   Suicidal/Homicidal: Nowithout intent/plan  Therapist Response: Dawn Crosby engaged well in individual session with Clinician. Clinician utilized CBT to process updates in thoughts, feelings, and behaviors. Clinician explored relationship with boyfriend and noted that his threat to end the relationship or to take a break was successful in motivating Dawn Crosby to apply to jobs. Dawn Crosby shared that she has been offered two jobs and it  waiting to hear from her 1st choice. Clinician processed experiences with interviews and responding to questions. Clinician identified increased levels of self confidence and excitement about life.   Plan: Return again in 2 weeks.  Diagnosis: Bipolar I disorder ([place])  Collaboration of Care: Other none required in this session  Patient/Guardian was advised Release of Information must be obtained prior to any record release in order to collaborate their care with an outside provider. Patient/Guardian was advised if they have not already done so to contact the registration department to sign all necessary forms in order for [place] to release information regarding their care.   Consent: Patient/Guardian gives verbal consent for treatment and assignment of benefits for services provided during this visit. Patient/Guardian expressed understanding and agreed to proceed.   Jobe Marker [place], LCSW 03/03/2022

## 2022-03-24 ENCOUNTER — Ambulatory Visit (INDEPENDENT_AMBULATORY_CARE_PROVIDER_SITE_OTHER): Payer: No Typology Code available for payment source | Admitting: Licensed Clinical Social Worker

## 2022-03-24 ENCOUNTER — Other Ambulatory Visit (HOSPITAL_COMMUNITY): Payer: Self-pay

## 2022-03-24 DIAGNOSIS — F319 Bipolar disorder, unspecified: Secondary | ICD-10-CM | POA: Diagnosis not present

## 2022-03-24 DIAGNOSIS — F4010 Social phobia, unspecified: Secondary | ICD-10-CM

## 2022-03-28 ENCOUNTER — Encounter (HOSPITAL_COMMUNITY): Payer: Self-pay | Admitting: Licensed Clinical Social Worker

## 2022-03-28 NOTE — Progress Notes (Signed)
Virtual Visit via Video Note  I connected with Delia Heady on 03/28/22 at  1:00 PM EST by a video enabled telemedicine application and verified that I am speaking with the correct person using two identifiers.  Location: Patient: home Provider: home office   I discussed the limitations of evaluation and management by telemedicine and the availability of in person appointments. The patient expressed understanding and agreed to proceed.   I discussed the assessment and treatment plan with the patient. The patient was provided an opportunity to ask questions and all were answered. The patient agreed with the plan and demonstrated an understanding of the instructions.   The patient was advised to call back or seek an in-person evaluation if the symptoms worsen or if the condition fails to improve as anticipated.  I provided 55 minutes of non-face-to-face time during this encounter.   [name], LCSW   THERAPIST PROGRESS NOTE  Session Time: 1:00pm-1:55pm  Participation Level: Active  Behavioral Response: NeatAlertEuthymic  Type of Therapy: Individual Therapy  Treatment Goals addressed: "coping with stress, transitions, social anxiety". [name] improve her anxiety 5 out of 7 days as evidenced by reduction of sxs, ability to use coping skills, and increased confidence in herself and her decisions.    ProgressTowards Goals: Progressing  Interventions: CBT  Summary: BLUE RUGGERIO is a 21 y.o. female who presents with Bipolar I Disorder and Social Anxiety Disorder.   Suicidal/Homicidal: Nowithout intent/plan  Therapist Response: [name] engaged well in virtual session with Facilities manager. Clinician utilized CBT to process and reflect updates in [name]'s life. Clinician processed thoughts and feelings of starting her new job. Clinician noted increase in joy, excitement, and positive self esteem. Clinician discussed ways this can reach further into [name]'s life, as she tries  to do new things. Clinician explored relationship with boyfriend and noted things have also improved. Clinician identified the value of selfcare and self esteem to motivate herself to do well. Clinician explored coping skills and ways to improve her self-motivation to complete tasks and progress.   Plan: Return again in 2 weeks.  Diagnosis: Bipolar I disorder ([facility])  Social anxiety disorder  Collaboration of Care: Other none required in this session  Patient/Guardian was advised Release of Information must be obtained prior to any record release in order to collaborate their care with an outside provider. Patient/Guardian was advised if they have not already done so to contact the registration department to sign all necessary forms in order for [place] to release information regarding their care.   Consent: Patient/Guardian gives verbal consent for treatment and assignment of benefits for services provided during this visit. Patient/Guardian expressed understanding and agreed to proceed.   Chryl Heck [place], LCSW 03/28/2022

## 2022-04-14 ENCOUNTER — Telehealth (HOSPITAL_COMMUNITY): Payer: No Typology Code available for payment source | Admitting: Licensed Clinical Social Worker

## 2022-04-14 ENCOUNTER — Ambulatory Visit (HOSPITAL_COMMUNITY): Payer: No Typology Code available for payment source | Admitting: Licensed Clinical Social Worker

## 2022-04-20 ENCOUNTER — Other Ambulatory Visit (HOSPITAL_COMMUNITY): Payer: Self-pay

## 2022-04-26 ENCOUNTER — Ambulatory Visit (INDEPENDENT_AMBULATORY_CARE_PROVIDER_SITE_OTHER): Payer: No Typology Code available for payment source | Admitting: Family Medicine

## 2022-04-26 ENCOUNTER — Encounter: Payer: Self-pay | Admitting: Family Medicine

## 2022-04-26 VITALS — BP 122/80 | HR 97 | Temp 98.4°F | Ht 64.0 in | Wt 269.0 lb

## 2022-04-26 DIAGNOSIS — J029 Acute pharyngitis, unspecified: Secondary | ICD-10-CM

## 2022-04-26 DIAGNOSIS — H6993 Unspecified Eustachian tube disorder, bilateral: Secondary | ICD-10-CM

## 2022-04-26 DIAGNOSIS — B349 Viral infection, unspecified: Secondary | ICD-10-CM

## 2022-04-26 LAB — POCT INFLUENZA A/B
Influenza A, POC: NEGATIVE
Influenza B, POC: NEGATIVE

## 2022-04-26 LAB — POCT RAPID STREP A (OFFICE): Rapid Strep A Screen: NEGATIVE

## 2022-04-26 NOTE — Progress Notes (Signed)
Established Patient Office Visit   Subjective:  Patient ID: Dawn Crosby, female    DOB: Sep 23, 2000  Age: 21 y.o. MRN: [id number]  Chief Complaint  Patient presents with   Sore Throat    Sore throat x 5 days, tickle in throat. No fever.     Sore Throat  Associated symptoms include coughing. Pertinent negatives include no abdominal pain, headaches, shortness of breath or vomiting.   Encounter Diagnoses  Name Primary?   Viral syndrome Yes   Sore throat    Dysfunction of both eustachian tubes    5-day history of postnasal drip sore throat and a scratchy cough.  Denies fevers chills, headache, difficulty breathing or wheezing, nausea or vomiting, myalgias or arthralgias."  Home COVID test was negative.   Review of Systems  Constitutional: Negative.   HENT:  Positive for sore throat.   Eyes:  Negative for blurred vision, discharge and redness.  Respiratory:  Positive for cough. Negative for sputum production, shortness of breath and wheezing.   Cardiovascular: Negative.   Gastrointestinal:  Negative for abdominal pain, nausea and vomiting.  Genitourinary: Negative.   Musculoskeletal: Negative.  Negative for joint pain and myalgias.  Skin:  Negative for rash.  Neurological:  Negative for tingling, loss of consciousness, weakness and headaches.  Endo/Heme/Allergies:  Negative for polydipsia.     Current Outpatient Medications:    amphetamine-dextroamphetamine (ADDERALL) 20 MG tablet, TAKE 1 TABLET BY MOUTH 2 TIMES DAILY, Disp: 60 tablet, Rfl: 0   amphetamine-dextroamphetamine (ADDERALL) 20 MG tablet, Take 1 tablet (20 mg total) by mouth 2 (two) times daily., Disp: 60 tablet, Rfl: 0   amphetamine-dextroamphetamine (ADDERALL) 20 MG tablet, TAKE 1 TABLET BY MOUTH 2 TIMES DAILY FOR ADD, Disp: 60 tablet, Rfl: 0   buPROPion (WELLBUTRIN XL) 300 MG 24 hr tablet, TAKE 1 TABLET BY MOUTH ONCE DAILY, Disp: 90 tablet, Rfl: 3   busPIRone (BUSPAR) 10 MG tablet, Take one tablet by mouth  twice daily., Disp: 180 tablet, Rfl: 3   cetirizine (ZYRTEC) 10 MG tablet, Take 10 mg by mouth at bedtime., Disp: , Rfl:    clonazePAM (KLONOPIN) 0.5 MG tablet, Take 1 tablet (0.5 mg total) by mouth daily as needed for anxiety or panic, Disp: 30 tablet, Rfl: 3   co-enzyme Q-10 30 MG capsule, Take 30 mg by mouth daily., Disp: , Rfl:    diphenhydrAMINE (BENADRYL) 25 mg capsule, Take 1 each (25 mg total) by mouth every 6 (six) hours as needed for allergies for up to 5 days., Disp: 12 capsule, Rfl: 0   docusate sodium (COLACE) 100 MG capsule, Take 100 mg by mouth at bedtime., Disp: , Rfl:    ibuprofen (ADVIL) 800 MG tablet, Take 1 tablet by mouth every 8 hours with food, Disp: 30 tablet, Rfl: 0   lamoTRIgine (LAMICTAL) 200 MG tablet, TAKE 1 TABLET BY MOUTH ONCE A DAY, Disp: 90 tablet, Rfl: 3   melatonin 5 MG TABS, Take 5 mg by mouth at bedtime., Disp: , Rfl:    Multiple Vitamin (MULTIVITAMIN) capsule, Take 1 capsule by mouth daily., Disp: , Rfl:    Omega-3 1000 MG CAPS, Take 1 capsule by mouth at bedtime., Disp: , Rfl:    QUEtiapine (SEROQUEL) 100 MG tablet, Take 0.5-1 tablets (50-100 mg total) by mouth as needed for sleep (Patient not taking: Reported on 04/26/2022), Disp: 30 tablet, Rfl: 5   Objective:     BP 122/80 (BP Location: Left Arm, Patient Position: Sitting, Cuff Size: Large)  Pulse 97   Temp 98.4 F (36.9 C) (Temporal)   Ht 5\' 4"  (1.626 m)   Wt 269 lb (122 kg)   SpO2 99%   BMI 46.17 kg/m    Physical Exam Constitutional:      General: She is not in acute distress.    Appearance: Normal appearance. She is not ill-appearing, toxic-appearing or diaphoretic.  HENT:     Head: Normocephalic and atraumatic.     Right Ear: Ear canal and external ear normal. No middle ear effusion. Tympanic membrane is retracted. Tympanic membrane is not erythematous.     Left Ear: Tympanic membrane, ear canal and external ear normal.     Mouth/Throat:     Mouth: Mucous membranes are moist. No oral  lesions.     Pharynx: Oropharynx is clear. No oropharyngeal exudate or posterior oropharyngeal erythema.  Eyes:     General: No scleral icterus.       Right eye: No discharge.        Left eye: No discharge.     Extraocular Movements: Extraocular movements intact.     Conjunctiva/sclera: Conjunctivae normal.     Pupils: Pupils are equal, round, and reactive to light.  Cardiovascular:     Rate and Rhythm: Normal rate and regular rhythm.  Pulmonary:     Effort: Pulmonary effort is normal. No respiratory distress.     Breath sounds: Normal breath sounds. No wheezing or rales.  Abdominal:     General: Bowel sounds are normal.     Tenderness: There is no abdominal tenderness. There is no guarding.  Musculoskeletal:     Cervical back: No rigidity or tenderness.  Lymphadenopathy:     Cervical: No cervical adenopathy.  Skin:    General: Skin is warm and dry.  Neurological:     Mental Status: She is alert and oriented to person, place, and time.  Psychiatric:        Mood and Affect: Mood normal.        Behavior: Behavior normal.      Results for orders placed or performed in visit on 04/26/22  POCT rapid strep A  Result Value Ref Range   Rapid Strep A Screen Negative Negative  POCT Influenza A/B  Result Value Ref Range   Influenza A, POC Negative Negative   Influenza B, POC Negative Negative      The ASCVD Risk score (Arnett DK, et al., 2019) failed to calculate for the following reasons:   The 2019 ASCVD risk score is only valid for ages 54 to 53    Assessment & Plan:   Viral syndrome -     POCT Influenza A/B  Sore throat -     POCT rapid strep A  Dysfunction of both eustachian tubes    Return in about 1 week (around 05/03/2022), or if symptoms worsen or fail to improve.  Continue Sudafed as needed.  Demonstrated eustachian tube exercises.  Information was given on ETD.  Strep and flu testing were negative  05/05/2022, MD

## 2022-04-27 ENCOUNTER — Ambulatory Visit (HOSPITAL_COMMUNITY): Payer: No Typology Code available for payment source | Admitting: Licensed Clinical Social Worker

## 2022-04-30 ENCOUNTER — Other Ambulatory Visit (HOSPITAL_COMMUNITY): Payer: Self-pay

## 2022-05-03 ENCOUNTER — Ambulatory Visit: Payer: No Typology Code available for payment source | Admitting: Adult Health

## 2022-05-05 ENCOUNTER — Other Ambulatory Visit (HOSPITAL_COMMUNITY): Payer: Self-pay

## 2022-05-05 ENCOUNTER — Other Ambulatory Visit: Payer: Self-pay

## 2022-05-05 ENCOUNTER — Telehealth: Payer: Self-pay | Admitting: Adult Health

## 2022-05-05 DIAGNOSIS — F900 Attention-deficit hyperactivity disorder, predominantly inattentive type: Secondary | ICD-10-CM

## 2022-05-05 MED ORDER — AMPHETAMINE-DEXTROAMPHETAMINE 20 MG PO TABS
20.0000 mg | ORAL_TABLET | Freq: Two times a day (BID) | ORAL | 0 refills | Status: DC
  Filled 2022-05-05: qty 60, 30d supply, fill #0

## 2022-05-05 NOTE — Telephone Encounter (Signed)
Next visit is 05/31/22. Requesting a refill on Adderall 20 mg called to:  [facility]   Phone: [phone]  Fax: [phone]    Per patient it is in stock at this location.

## 2022-05-05 NOTE — Telephone Encounter (Signed)
Pended.

## 2022-05-19 ENCOUNTER — Ambulatory Visit (HOSPITAL_COMMUNITY): Payer: 59 | Admitting: Licensed Clinical Social Worker

## 2022-05-28 ENCOUNTER — Other Ambulatory Visit (HOSPITAL_COMMUNITY): Payer: Self-pay

## 2022-05-30 ENCOUNTER — Other Ambulatory Visit (HOSPITAL_COMMUNITY): Payer: Self-pay

## 2022-05-31 ENCOUNTER — Encounter: Payer: Self-pay | Admitting: Adult Health

## 2022-05-31 ENCOUNTER — Ambulatory Visit (INDEPENDENT_AMBULATORY_CARE_PROVIDER_SITE_OTHER): Payer: 59 | Admitting: Adult Health

## 2022-05-31 ENCOUNTER — Other Ambulatory Visit (HOSPITAL_COMMUNITY): Payer: Self-pay

## 2022-05-31 DIAGNOSIS — F4010 Social phobia, unspecified: Secondary | ICD-10-CM | POA: Diagnosis not present

## 2022-05-31 DIAGNOSIS — F900 Attention-deficit hyperactivity disorder, predominantly inattentive type: Secondary | ICD-10-CM | POA: Diagnosis not present

## 2022-05-31 DIAGNOSIS — F319 Bipolar disorder, unspecified: Secondary | ICD-10-CM | POA: Diagnosis not present

## 2022-05-31 DIAGNOSIS — F330 Major depressive disorder, recurrent, mild: Secondary | ICD-10-CM

## 2022-05-31 MED ORDER — BUSPIRONE HCL 10 MG PO TABS
ORAL_TABLET | ORAL | 3 refills | Status: DC
  Filled 2022-05-31: qty 180, 90d supply, fill #0
  Filled 2022-11-23 – 2022-11-24 (×2): qty 180, 90d supply, fill #1

## 2022-05-31 MED ORDER — AMPHETAMINE-DEXTROAMPHETAMINE 20 MG PO TABS
20.0000 mg | ORAL_TABLET | Freq: Two times a day (BID) | ORAL | 0 refills | Status: DC
  Filled 2022-08-08: qty 60, 30d supply, fill #0

## 2022-05-31 MED ORDER — QUETIAPINE FUMARATE 25 MG PO TABS
25.0000 mg | ORAL_TABLET | Freq: Every day | ORAL | 3 refills | Status: DC
  Filled 2022-05-31: qty 90, 90d supply, fill #0

## 2022-05-31 MED ORDER — BUPROPION HCL ER (XL) 300 MG PO TB24
ORAL_TABLET | Freq: Every day | ORAL | 3 refills | Status: DC
  Filled 2022-05-31: qty 90, 90d supply, fill #0
  Filled 2022-10-27: qty 90, 90d supply, fill #1
  Filled 2023-01-25: qty 90, 90d supply, fill #2

## 2022-05-31 MED ORDER — LAMOTRIGINE 200 MG PO TABS
ORAL_TABLET | Freq: Every day | ORAL | 3 refills | Status: DC
  Filled 2022-05-31: qty 90, fill #0
  Filled 2022-08-17: qty 90, 90d supply, fill #0
  Filled 2022-11-16: qty 90, 90d supply, fill #1

## 2022-05-31 MED ORDER — AMPHETAMINE-DEXTROAMPHETAMINE 20 MG PO TABS
20.0000 mg | ORAL_TABLET | Freq: Two times a day (BID) | ORAL | 0 refills | Status: DC
  Filled 2022-05-31: qty 60, 30d supply, fill #0

## 2022-05-31 MED ORDER — AMPHETAMINE-DEXTROAMPHETAMINE 20 MG PO TABS: ORAL_TABLET | ORAL | 0 refills | Status: DC

## 2022-05-31 NOTE — Progress Notes (Addendum)
Dawn Crosby [id number] 06-26-2000 22 y.o.  Subjective:   Patient ID:  Dawn Crosby is a 22 y.o. (DOB Nov 02, 2000) female.  Chief Complaint: No chief complaint on file.   HPI Dawn Crosby presents to the office today for follow-up of Bipolar disorder, ADHD, SAD, Social phobia.  Describes mood today as "ok". Pleasant. Tearful at times. Mood symptoms - reports some depression, anxiety and irritability. Reports some worry, rumination, and over thinking. Mood is consistent - "struggles during menstrual cycle". Stating "I'm doing alright". Feels like current medications are working well. Stable interest and motivation. Taking medications as prescribed.  Energy levels pretty good. Active, does not have a regular exercise routine. Walking.  Enjoys some usual interests and activities. Dating. Has a boyfriend. Living at home. Spending time with family. Appetite adequate. Weight stable - 270 pounds. Sleeps well most nights. Averages 8 or more hours. Focus and concentration stable. Completing tasks. Managing aspects of household. Working in Scientist, research (medical). Denies SI or HI.  Denies AH or VH. Self harm - clean x 620 days. Seeing therapist.    [facility] Office Visit from 04/26/2022 in [place]  PHQ-2 Total Score 0      [place] ED from 08/31/2020 in [facility] [place] No Risk        Review of Systems:  Review of Systems  Musculoskeletal:  Negative for gait problem.  Neurological:  Negative for tremors.  Psychiatric/Behavioral:         Please refer to HPI    Medications: I have reviewed the patient's current medications.  Current Outpatient Medications  Medication Sig Dispense Refill   QUEtiapine (SEROQUEL) 25 MG tablet Take 1 tablet (25 mg total) by mouth at bedtime. 90 tablet 3   [START ON 06/28/2022] amphetamine-dextroamphetamine (ADDERALL) 20 MG tablet TAKE 1 TABLET BY MOUTH 2 TIMES DAILY FOR  ADD 60 tablet 0   amphetamine-dextroamphetamine (ADDERALL) 20 MG tablet Take 1 tablet (20 mg total) by mouth 2 (two) times daily. 60 tablet 0   [START ON 07/26/2022] amphetamine-dextroamphetamine (ADDERALL) 20 MG tablet Take 1 tablet (20 mg total) by mouth 2 (two) times daily. 60 tablet 0   buPROPion (WELLBUTRIN XL) 300 MG 24 hr tablet TAKE 1 TABLET BY MOUTH ONCE DAILY 90 tablet 3   busPIRone (BUSPAR) 10 MG tablet Take one tablet by mouth twice daily. 180 tablet 3   cetirizine (ZYRTEC) 10 MG tablet Take 10 mg by mouth at bedtime.     clonazePAM (KLONOPIN) 0.5 MG tablet Take 1 tablet (0.5 mg total) by mouth daily as needed for anxiety or panic 30 tablet 3   co-enzyme Q-10 30 MG capsule Take 30 mg by mouth daily.     diphenhydrAMINE (BENADRYL) 25 mg capsule Take 1 each (25 mg total) by mouth every 6 (six) hours as needed for allergies for up to 5 days. 12 capsule 0   docusate sodium (COLACE) 100 MG capsule Take 100 mg by mouth at bedtime.     ibuprofen (ADVIL) 800 MG tablet Take 1 tablet by mouth every 8 hours with food 30 tablet 0   lamoTRIgine (LAMICTAL) 200 MG tablet TAKE 1 TABLET BY MOUTH ONCE A DAY 90 tablet 3   melatonin 5 MG TABS Take 5 mg by mouth at bedtime.     Multiple Vitamin (MULTIVITAMIN) capsule Take 1 capsule by mouth daily.     Omega-3 1000 MG CAPS Take 1 capsule by  mouth at bedtime.     No current facility-administered medications for this visit.    Medication Side Effects: None  Allergies:  Allergies  Allergen Reactions   Bactrim [Sulfamethoxazole-Trimethoprim] Hives    Possible hives; pt was on bactrim and pcn, unsure which caused hives   Clindamycin/Lincomycin Hives   Kiwi Extract Other (See Comments)    Tongue tingling  Other reaction(s): tongue tingle   Penicillins Hives    Possible hives; pt was on bactrim and pcn, unsure which caused hives   Pineapple Other (See Comments)    Tongue allergy  Other reaction(s): tongue tingle   Prozac [Fluoxetine Hcl] Hives    Adhesive [Tape] Rash   Clindamycin Rash   Fluoxetine Rash    Other reaction(s): hives, itching    Past Medical History:  Diagnosis Date   ADHD    Anxiety    Asthma    as child    Bipolar disorder ([place])    Depression     Past Medical History, Surgical history, Social history, and Family history were reviewed and updated as appropriate.   Please see review of systems for further details on the patient's review from today.   Objective:   Physical Exam:  There were no vitals taken for this visit.  Physical Exam Constitutional:      General: She is not in acute distress. Musculoskeletal:        General: No deformity.  Neurological:     Mental Status: She is alert and oriented to person, place, and time.     Coordination: Coordination normal.  Psychiatric:        Attention and Perception: Attention and perception normal. She does not perceive auditory or visual hallucinations.        Mood and Affect: Mood normal. Mood is not anxious or depressed. Affect is not labile, blunt, angry or inappropriate.        Speech: Speech normal.        Behavior: Behavior normal.        Thought Content: Thought content normal. Thought content is not paranoid or delusional. Thought content does not include homicidal or suicidal ideation. Thought content does not include homicidal or suicidal plan.        Cognition and Memory: Cognition and memory normal.        Judgment: Judgment normal.     Comments: Insight intact     Lab Review:     Component Value Date/Time   NA 139 04/01/2021 1143   K 3.9 04/01/2021 1143   CL 104 04/01/2021 1143   CO2 28 04/01/2021 1143   GLUCOSE 87 04/01/2021 1143   BUN 12 04/01/2021 1143   CREATININE 0.91 04/01/2021 1143   CREATININE 0.69 12/31/2015 0001   CALCIUM 9.6 04/01/2021 1143   PROT 7.1 04/01/2021 1143   ALBUMIN 4.4 04/01/2021 1143   AST 14 04/01/2021 1143   ALT 13 04/01/2021 1143   ALKPHOS 70 04/01/2021 1143   BILITOT 0.3 04/01/2021 1143        Component Value Date/Time   WBC 7.8 04/01/2021 1143   RBC 4.41 04/01/2021 1143   HGB 13.0 04/01/2021 1143   HCT 38.3 04/01/2021 1143   PLT 335.0 04/01/2021 1143   MCV 86.9 04/01/2021 1143   MCHC 34.1 04/01/2021 1143   RDW 12.5 04/01/2021 1143    No results found for: "POCLITH", "LITHIUM"   No results found for: "PHENYTOIN", "PHENOBARB", "VALPROATE", "CBMZ"   .res Assessment: Plan:    Plan:  PDMP reviewed  Adderall 20mg  BID Buspar 10mg  BID Lamictal 200mg  daily Wellbutrin XL 300mg  in the am  Seroquel 100mg  - 1/2 to one tablet as needed Clonazepam 0.5mg  as needed for anxiety  3 months  Discussed potential benefits, risk, and side effects of benzodiazepines to include potential risk of tolerance and dependence, as well as possible drowsiness.  Advised patient not to drive if experiencing drowsiness and to take lowest possible effective dose to minimize risk of dependence and tolerance.   Discussed potential benefits, risks, and side effects of stimulants with patient to include increased heart rate, palpitations, insomnia, increased anxiety, increased irritability, or decreased appetite.  Instructed patient to contact office if experiencing any significant tolerability issues.   Discussed potential metabolic side effects associated with atypical antipsychotics, as well as potential risk for movement side effects. Advised pt to contact office if movement side effects occur.    Counseled patient regarding potential benefits, risks, and side effects of Lamictal to include potential risk of Stevens-Johnson syndrome. Advised patient to stop taking Lamictal and contact office immediately if rash develops and to seek urgent medical attention if rash is severe and/or spreading quickly.  Diagnoses and all orders for this visit:  Bipolar I disorder (HCC) -     QUEtiapine (SEROQUEL) 25 MG tablet; Take 1 tablet (25 mg total) by mouth at bedtime. -     lamoTRIgine (LAMICTAL) 200 MG  tablet; TAKE 1 TABLET BY MOUTH ONCE A DAY  ADHD (attention deficit hyperactivity disorder), inattentive type -     amphetamine-dextroamphetamine (ADDERALL) 20 MG tablet; TAKE 1 TABLET BY MOUTH 2 TIMES DAILY FOR ADD -     amphetamine-dextroamphetamine (ADDERALL) 20 MG tablet; Take 1 tablet (20 mg total) by mouth 2 (two) times daily. -     amphetamine-dextroamphetamine (ADDERALL) 20 MG tablet; Take 1 tablet (20 mg total) by mouth 2 (two) times daily.  Social anxiety disorder -     busPIRone (BUSPAR) 10 MG tablet; Take one tablet by mouth twice daily.  Social phobia -     busPIRone (BUSPAR) 10 MG tablet; Take one tablet by mouth twice daily.  MDD (major depressive disorder), recurrent episode, mild (HCC) -     buPROPion (WELLBUTRIN XL) 300 MG 24 hr tablet; TAKE 1 TABLET BY MOUTH ONCE DAILY     Please see After Visit Summary for patient specific instructions.  Future Appointments  Date Time Provider Department Center  06/02/2022 11:00 AM , , LCSW [facility] None  06/16/2022 12:30 PM [place], , LCSW [facility] None    No orders of the defined types were placed in this encounter.   -------------------------------

## 2022-06-02 ENCOUNTER — Ambulatory Visit (HOSPITAL_COMMUNITY): Payer: 59 | Admitting: Licensed Clinical Social Worker

## 2022-06-08 DIAGNOSIS — F319 Bipolar disorder, unspecified: Secondary | ICD-10-CM | POA: Insufficient documentation

## 2022-06-16 ENCOUNTER — Encounter (HOSPITAL_COMMUNITY): Payer: Self-pay | Admitting: Licensed Clinical Social Worker

## 2022-06-16 ENCOUNTER — Ambulatory Visit (INDEPENDENT_AMBULATORY_CARE_PROVIDER_SITE_OTHER): Payer: 59 | Admitting: Licensed Clinical Social Worker

## 2022-06-16 DIAGNOSIS — F319 Bipolar disorder, unspecified: Secondary | ICD-10-CM

## 2022-06-16 NOTE — Progress Notes (Signed)
Virtual Visit via Video Note  I connected with Dawn Crosby on 06/16/22 at 12:30 PM EST by a video enabled telemedicine application and verified that I am speaking with the correct person using two identifiers.  Location: Patient: home Provider: home office   I discussed the limitations of evaluation and management by telemedicine and the availability of in person appointments. The patient expressed understanding and agreed to proceed.   I discussed the assessment and treatment plan with the patient. The patient was provided an opportunity to ask questions and all were answered. The patient agreed with the plan and demonstrated an understanding of the instructions.   The patient was advised to call back or seek an in-person evaluation if the symptoms worsen or if the condition fails to improve as anticipated.  I provided 60 minutes of non-face-to-face time during this encounter.   [name], LCSW   THERAPIST PROGRESS NOTE  Session Time: 12:30pm-1:30pm  Participation Level: Active  Behavioral Response: CasualAlertEuthymic  Type of Therapy: Individual Therapy  Treatment Goals addressed: "coping with stress, transitions, social anxiety". Dawn Crosby will improve her anxiety 5 out of 7 days as evidenced by reduction of sxs, ability to use coping skills, and increased confidence in herself and her decisions.     ProgressTowards Goals: Progressing  Interventions: CBT  Summary: Dawn Crosby is a 22 y.o. female who presents with Bipolar I Disorder.   Suicidal/Homicidal: Nowithout intent/plan  Therapist Response: Dawn Crosby engaged well in Therapist, sports with Pension scheme manager. Clinician utilized CBT to process updates in thoughts, feelings, behaviors, motivation, and relationships. Dawn Crosby shared improvement in mood, which is due largely to becoming a regular employee and liking her job. Clinician discussed decision-making and motivation levels in her work, noting that pride  has become a big part of her life. Clinician processed relationship with boyfriend and his family, noting plans to marry in the future, and working toward that goal. Clinician also assisted Dawn Crosby in processing her concerns about her cousin and family. Clinician provided time and space for Dawn Crosby to share and assisted in deciding how involved Dawn Crosby wants to become in this issue.    Plan: Return again in 3 weeks.  Diagnosis: Bipolar I disorder ([place])  Collaboration of Care: Patient refused AEB none required in this session  Patient/Guardian was advised Release of Information must be obtained prior to any record release in order to collaborate their care with an outside provider. Patient/Guardian was advised if they have not already done so to contact the registration department to sign all necessary forms in order for [place] to release information regarding their care.   Consent: Patient/Guardian gives verbal consent for treatment and assignment of benefits for services provided during this visit. Patient/Guardian expressed understanding and agreed to proceed.   [place], LCSW 06/16/2022

## 2022-07-06 ENCOUNTER — Encounter (HOSPITAL_COMMUNITY): Payer: Self-pay

## 2022-07-06 ENCOUNTER — Ambulatory Visit (INDEPENDENT_AMBULATORY_CARE_PROVIDER_SITE_OTHER): Payer: 59 | Admitting: Licensed Clinical Social Worker

## 2022-07-06 ENCOUNTER — Encounter (HOSPITAL_COMMUNITY): Payer: Self-pay | Admitting: Licensed Clinical Social Worker

## 2022-07-06 DIAGNOSIS — F319 Bipolar disorder, unspecified: Secondary | ICD-10-CM

## 2022-07-06 NOTE — Progress Notes (Signed)
Virtual Visit via Video Note  I connected with Dawn Crosby on 07/06/22 at 11:00 AM EST by a video enabled telemedicine application and verified that I am speaking with the correct person using two identifiers.  Location: Patient: home Provider: home office   I discussed the limitations of evaluation and management by telemedicine and the availability of in person appointments. The patient expressed understanding and agreed to proceed.   I discussed the assessment and treatment plan with the patient. The patient was provided an opportunity to ask questions and all were answered. The patient agreed with the plan and demonstrated an understanding of the instructions.   The patient was advised to call back or seek an in-person evaluation if the symptoms worsen or if the condition fails to improve as anticipated.  I provided 55 minutes of non-face-to-face time during this encounter.   [name], LCSW   THERAPIST PROGRESS NOTE  Session Time: 11:00am-11:55am  Participation Level: Active  Behavioral Response: NeatAlertEuthymic  Type of Therapy: Individual Therapy  Treatment Goals addressed:  "coping with stress, transitions, social anxiety". Dawn Crosby will improve her anxiety 5 out of 7 days as evidenced by reduction of sxs, ability to use coping skills, and increased confidence in herself and her decisions.    ProgressTowards Goals: Progressing  Interventions: CBT  Summary: Dawn Crosby is a 22 y.o. female who presents with Bipolar I Disorder.   Suicidal/Homicidal: Nowithout intent/plan  Therapist Response: Dawn Crosby engaged well in individual session with clinician. Clinician utilized CBT to process thoughts, feelings, and interactions with others. Clinician noted progress in managing her emotions and stability in that she continues to work her job and has been enjoying it. Dawn Crosby shared updates with relationship and identified some feelings about possibly getting engaged  in the future. Clinician discussed aging and Dawn Crosby's upcoming birthday. Clinician processed thoughts and feelings about becoming an adult and entering her 40s. Clinician also noted the value of maintaining as much freedom and taking on appropriate levels of responsibility as she feels comfortable. Clinician discussed reactions to other people's words, noting that this is a trigger for hurt feelings.   Plan: Return again in 3 weeks.  Diagnosis: Bipolar I disorder ([place])  Collaboration of Care: Medication Management AEB reviewed last note from psyc provider  Patient/Guardian was advised Release of Information must be obtained prior to any record release in order to collaborate their care with an outside provider. Patient/Guardian was advised if they have not already done so to contact the registration department to sign all necessary forms in order for [place] to release information regarding their care.   Consent: Patient/Guardian gives verbal consent for treatment and assignment of benefits for services provided during this visit. Patient/Guardian expressed understanding and agreed to proceed.   Jobe Marker [place], LCSW 07/06/2022

## 2022-08-08 ENCOUNTER — Other Ambulatory Visit (HOSPITAL_COMMUNITY): Payer: Self-pay

## 2022-08-09 ENCOUNTER — Other Ambulatory Visit (HOSPITAL_COMMUNITY): Payer: Self-pay

## 2022-08-18 ENCOUNTER — Other Ambulatory Visit (HOSPITAL_COMMUNITY): Payer: Self-pay

## 2022-08-30 ENCOUNTER — Ambulatory Visit: Payer: 59 | Admitting: Adult Health

## 2022-09-09 ENCOUNTER — Ambulatory Visit: Payer: 59 | Admitting: Adult Health

## 2022-09-23 ENCOUNTER — Other Ambulatory Visit (HOSPITAL_COMMUNITY): Payer: Self-pay

## 2022-09-23 ENCOUNTER — Ambulatory Visit (INDEPENDENT_AMBULATORY_CARE_PROVIDER_SITE_OTHER): Payer: 59 | Admitting: Adult Health

## 2022-09-23 ENCOUNTER — Encounter: Payer: Self-pay | Admitting: Adult Health

## 2022-09-23 DIAGNOSIS — F319 Bipolar disorder, unspecified: Secondary | ICD-10-CM | POA: Diagnosis not present

## 2022-09-23 DIAGNOSIS — F4010 Social phobia, unspecified: Secondary | ICD-10-CM | POA: Diagnosis not present

## 2022-09-23 DIAGNOSIS — F900 Attention-deficit hyperactivity disorder, predominantly inattentive type: Secondary | ICD-10-CM | POA: Diagnosis not present

## 2022-09-23 MED ORDER — AMPHETAMINE-DEXTROAMPHETAMINE 20 MG PO TABS
20.0000 mg | ORAL_TABLET | Freq: Two times a day (BID) | ORAL | 0 refills | Status: DC
Start: 2022-10-21 — End: 2022-11-04

## 2022-09-23 MED ORDER — AMPHETAMINE-DEXTROAMPHETAMINE 20 MG PO TABS
ORAL_TABLET | ORAL | 0 refills | Status: DC
  Filled 2022-09-23: qty 60, 30d supply, fill #0

## 2022-09-23 MED ORDER — CLONAZEPAM 0.5 MG PO TABS
0.5000 mg | ORAL_TABLET | Freq: Every day | ORAL | 2 refills | Status: DC | PRN
Start: 2022-09-23 — End: 2023-02-03
  Filled 2022-09-23: qty 30, 30d supply, fill #0

## 2022-09-23 MED ORDER — AMPHETAMINE-DEXTROAMPHETAMINE 20 MG PO TABS
20.0000 mg | ORAL_TABLET | Freq: Two times a day (BID) | ORAL | 0 refills | Status: DC
Start: 2022-11-18 — End: 2022-11-04

## 2022-09-23 MED ORDER — LAMOTRIGINE 25 MG PO TABS
ORAL_TABLET | ORAL | 1 refills | Status: DC
Start: 2022-09-23 — End: 2023-02-03
  Filled 2022-09-23: qty 180, 90d supply, fill #0

## 2022-09-23 NOTE — Progress Notes (Signed)
Dawn Crosby [id number] Feb 15, 2001 22 y.o.  Subjective:   Patient ID:  Dawn Crosby is a 22 y.o. (DOB 2000/08/09) female.  Chief Complaint: No chief complaint on file.   HPI Dawn Crosby presents to the office today for follow-up of Bipolar disorder, ADHD, SAD, Social phobia.  Describes mood today as "ok". Pleasant. Tearful at times. Mood symptoms - reports increased depression,  anxiety, and irritability. Reports some worry, rumination, and over thinking. Mood is lower. Reports situational stressors with recent break up with boyfriend. Stating "I haven't been doing too good". Feels like current medications are helpful.  Stable interest and motivation. Taking medications as prescribed.  Energy levels lower. Active, does not have a regular exercise routine. Walking.  Enjoys some usual interests and activities. Recently single. Living at home. Spending time with family. Appetite adequate. Weight stable - 220 to 240 pounds. Sleeps well most nights. Averages 6 to 8 or more hours. Focus and concentration stable. Completing tasks. Managing aspects of household. 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 Denies SI or HI.  Denies AH or VH. Denies self harm - 2 years. Denies substance use. Seeing therapist.    [phone]    Flowsheet Row Office Visit from 04/26/2022 in [facility] Total Score 0      Flowsheet Row ED from 08/31/2020 in [facility] RISK CATEGORY No Risk        Review of Systems:  Review of Systems  Musculoskeletal:  Negative for gait problem.  Neurological:  Negative for tremors.  Psychiatric/Behavioral:         Please refer to HPI    Medications: I have reviewed the patient's current medications.  Current Outpatient Medications  Medication Sig Dispense Refill   lamoTRIgine (LAMICTAL) 25 MG tablet Take two tablets at bedtime. 180 tablet 1   amphetamine-dextroamphetamine  (ADDERALL) 20 MG tablet TAKE 1 TABLET BY MOUTH 2 TIMES DAILY FOR ADD 60 tablet 0   [START ON 10/21/2022] amphetamine-dextroamphetamine (ADDERALL) 20 MG tablet Take 1 tablet (20 mg total) by mouth 2 (two) times daily. 60 tablet 0   [START ON 11/18/2022] amphetamine-dextroamphetamine (ADDERALL) 20 MG tablet Take 1 tablet (20 mg total) by mouth 2 (two) times daily. 60 tablet 0   buPROPion (WELLBUTRIN XL) 300 MG 24 hr tablet TAKE 1 TABLET BY MOUTH ONCE DAILY 90 tablet 3   busPIRone (BUSPAR) 10 MG tablet Take one tablet by mouth twice daily. 180 tablet 3   cetirizine (ZYRTEC) 10 MG tablet Take 10 mg by mouth at bedtime.     clonazePAM (KLONOPIN) 0.5 MG tablet Take 1 tablet (0.5 mg total) by mouth daily as needed for anxiety or panic 30 tablet 2   co-enzyme Q-10 30 MG capsule Take 30 mg by mouth daily.     diphenhydrAMINE (BENADRYL) 25 mg capsule Take 1 each (25 mg total) by mouth every 6 (six) hours as needed for allergies for up to 5 days. 12 capsule 0   docusate sodium (COLACE) 100 MG capsule Take 100 mg by mouth at bedtime.     ibuprofen (ADVIL) 800 MG tablet Take 1 tablet by mouth every 8 hours with food 30 tablet 0   lamoTRIgine (LAMICTAL) 200 MG tablet TAKE 1 TABLET BY MOUTH ONCE A DAY 90 tablet 3   melatonin 5 MG TABS Take 5 mg by mouth at bedtime.     Multiple Vitamin (MULTIVITAMIN) capsule Take 1 capsule by  mouth daily.     Omega-3 1000 MG CAPS Take 1 capsule by mouth at bedtime.     QUEtiapine (SEROQUEL) 25 MG tablet Take 1 tablet (25 mg total) by mouth at bedtime. 90 tablet 3   No current facility-administered medications for this visit.    Medication Side Effects: None  Allergies:  Allergies  Allergen Reactions   Bactrim [Sulfamethoxazole-Trimethoprim] Hives    Possible hives; pt was on bactrim and pcn, unsure which caused hives   Clindamycin/Lincomycin Hives   Kiwi Extract Other (See Comments)    Tongue tingling  Other reaction(s): tongue tingle   Penicillins Hives    Possible  hives; pt was on bactrim and pcn, unsure which caused hives   Pineapple Other (See Comments)    Tongue allergy  Other reaction(s): tongue tingle   Prozac [Fluoxetine Hcl] Hives   Adhesive [Tape] Rash   Clindamycin Rash   Fluoxetine Rash    Other reaction(s): hives, itching    Past Medical History:  Diagnosis Date   ADHD    Anxiety    Asthma    as child    Bipolar disorder (HCC)    Depression     Past Medical History, Surgical history, Social history, and Family history were reviewed and updated as appropriate.   Please see review of systems for further details on the patient's review from today.   Objective:   Physical Exam:  There were no vitals taken for this visit.  Physical Exam Constitutional:      General: She is not in acute distress. Musculoskeletal:        General: No deformity.  Neurological:     Mental Status: She is alert and oriented to person, place, and time.     Coordination: Coordination normal.  Psychiatric:        Attention and Perception: Attention and perception normal. She does not perceive auditory or visual hallucinations.        Mood and Affect: Mood normal. Mood is not anxious or depressed. Affect is not labile, blunt, angry or inappropriate.        Speech: Speech normal.        Behavior: Behavior normal.        Thought Content: Thought content normal. Thought content is not paranoid or delusional. Thought content does not include homicidal or suicidal ideation. Thought content does not include homicidal or suicidal plan.        Cognition and Memory: Cognition and memory normal.        Judgment: Judgment normal.     Comments: Insight intact     Lab Review:     Component Value Date/Time   NA 139 04/01/2021 1143   K 3.9 04/01/2021 1143   CL 104 04/01/2021 1143   CO2 28 04/01/2021 1143   GLUCOSE 87 04/01/2021 1143   BUN 12 04/01/2021 1143   CREATININE 0.91 04/01/2021 1143   CREATININE 0.69 12/31/2015 0001   CALCIUM 9.6 04/01/2021  1143   PROT 7.1 04/01/2021 1143   ALBUMIN 4.4 04/01/2021 1143   AST 14 04/01/2021 1143   ALT 13 04/01/2021 1143   ALKPHOS 70 04/01/2021 1143   BILITOT 0.3 04/01/2021 1143       Component Value Date/Time   WBC 7.8 04/01/2021 1143   RBC 4.41 04/01/2021 1143   HGB 13.0 04/01/2021 1143   HCT 38.3 04/01/2021 1143   PLT 335.0 04/01/2021 1143   MCV 86.9 04/01/2021 1143   MCHC 34.1 04/01/2021 1143   RDW  12.5 04/01/2021 1143    No results found for: "POCLITH", "LITHIUM"   No results found for: "PHENYTOIN", "PHENOBARB", "VALPROATE", "CBMZ"   .res Assessment: Plan:    Plan:  PDMP reviewed  Adderall 20mg  BID Buspar 10mg  BID Lamictal 200mg  daily Wellbutrin XL 300mg  in the am  Seroquel 100mg  - 1/2 to one tablet as needed Clonazepam 0.5mg  as needed for anxiety  3 months  Discussed potential benefits, risk, and side effects of benzodiazepines to include potential risk of tolerance and dependence, as well as possible drowsiness.  Advised patient not to drive if experiencing drowsiness and to take lowest possible effective dose to minimize risk of dependence and tolerance.   Discussed potential benefits, risks, and side effects of stimulants with patient to include increased heart rate, palpitations, insomnia, increased anxiety, increased irritability, or decreased appetite.  Instructed patient to contact office if experiencing any significant tolerability issues.   Discussed potential metabolic side effects associated with atypical antipsychotics, as well as potential risk for movement side effects. Advised pt to contact office if movement side effects occur.    Counseled patient regarding potential benefits, risks, and side effects of Lamictal to include potential risk of Stevens-Johnson syndrome. Advised patient to stop taking Lamictal and contact office immediately if rash develops and to seek urgent medical attention if rash is severe and/or spreading quickly.  Diagnoses and all  orders for this visit:  Bipolar I disorder (HCC) -     lamoTRIgine (LAMICTAL) 25 MG tablet; Take two tablets at bedtime.  ADHD (attention deficit hyperactivity disorder), inattentive type -     amphetamine-dextroamphetamine (ADDERALL) 20 MG tablet; TAKE 1 TABLET BY MOUTH 2 TIMES DAILY FOR ADD -     amphetamine-dextroamphetamine (ADDERALL) 20 MG tablet; Take 1 tablet (20 mg total) by mouth 2 (two) times daily. -     amphetamine-dextroamphetamine (ADDERALL) 20 MG tablet; Take 1 tablet (20 mg total) by mouth 2 (two) times daily.  Social anxiety disorder -     clonazePAM (KLONOPIN) 0.5 MG tablet; Take 1 tablet (0.5 mg total) by mouth daily as needed for anxiety or panic  Social phobia     Please see After Visit Summary for patient specific instructions.  Future Appointments  Date Time Provider Department Center  09/28/2022  2:30 PM Maia Plan, Chryl Heck, LCSW [facility] None    No orders of the defined types were placed in this encounter.   -------------------------------

## 2022-09-26 ENCOUNTER — Other Ambulatory Visit (HOSPITAL_COMMUNITY): Payer: Self-pay

## 2022-09-28 ENCOUNTER — Ambulatory Visit (INDEPENDENT_AMBULATORY_CARE_PROVIDER_SITE_OTHER): Payer: 59 | Admitting: Licensed Clinical Social Worker

## 2022-09-28 DIAGNOSIS — F319 Bipolar disorder, unspecified: Secondary | ICD-10-CM | POA: Diagnosis not present

## 2022-09-29 ENCOUNTER — Encounter (HOSPITAL_COMMUNITY): Payer: Self-pay | Admitting: Licensed Clinical Social Worker

## 2022-09-29 NOTE — Progress Notes (Signed)
Virtual Visit via Video Note  I connected with Dawn Crosby on 09/29/22 at  2:30 PM EDT by a video enabled telemedicine application and verified that I am speaking with the correct person using two identifiers.  Location: Patient: home Provider: home office   I discussed the limitations of evaluation and management by telemedicine and the availability of in person appointments. The patient expressed understanding and agreed to proceed.   I discussed the assessment and treatment plan with the patient. The patient was provided an opportunity to ask questions and all were answered. The patient agreed with the plan and demonstrated an understanding of the instructions.   The patient was advised to call back or seek an in-person evaluation if the symptoms worsen or if the condition fails to improve as anticipated.  I provided 55 minutes of non-face-to-face time during this encounter.   [name], LCSW   THERAPIST PROGRESS NOTE  Session Time: 2:30pm-3:25pm  Participation Level: Active  Behavioral Response: NeatAlertDepressed  Type of Therapy: Individual Therapy  Treatment Goals addressed:  "coping with stress, transitions, social anxiety". Dawn Crosby will improve her anxiety 5 out of 7 days as evidenced by reduction of sxs, ability to use coping skills, and increased confidence in herself and her decisions.      ProgressTowards Goals: Progressing  Interventions: CBT  Summary: NOTA BEW is a 22 y.o. female who presents with Bipolar I, depressed.   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 Clinician utilized CBT to process thoughts, feelings, and interactions with friends and family. Joana shared updates, including recent breakup with boyfriend. Clinician processed thoughts and feelings about this, particularly since they were having conversations about marriage. Clinician  provided CBT reality testing, as well as thought stopping when negative thinking or cognitive distortions popped into her mind. Clinician validated hurt feelings and reminded Analiz that she continues to make progress in her life for herself, not for others. 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 She also reports she is doing well at work and continues to enjoy her job.   Plan: Return again in 2 weeks.  Diagnosis: Bipolar I disorder (HCC)  Collaboration of Care: Patient refused AEB none required  Patient/Guardian was advised Release of Information must be obtained prior to any record release in order to collaborate their care with an outside provider. Patient/Guardian was advised if they have not already done so to contact the registration department to sign all necessary forms in order for [place] to release information regarding their care.   Consent: Patient/Guardian gives verbal consent for treatment and assignment of benefits for services provided during this visit. Patient/Guardian expressed understanding and agreed to proceed.   Chryl Heck [place], LCSW 09/29/2022

## 2022-10-06 ENCOUNTER — Other Ambulatory Visit (HOSPITAL_COMMUNITY): Payer: Self-pay

## 2022-10-06 MED ORDER — AZITHROMYCIN 500 MG PO TABS
500.0000 mg | ORAL_TABLET | Freq: Every day | ORAL | 0 refills | Status: AC
  Filled 2022-10-06: qty 3, 3d supply, fill #0

## 2022-10-13 ENCOUNTER — Ambulatory Visit (INDEPENDENT_AMBULATORY_CARE_PROVIDER_SITE_OTHER): Payer: 59 | Admitting: Licensed Clinical Social Worker

## 2022-10-13 DIAGNOSIS — F319 Bipolar disorder, unspecified: Secondary | ICD-10-CM

## 2022-10-14 ENCOUNTER — Encounter (HOSPITAL_COMMUNITY): Payer: Self-pay | Admitting: Licensed Clinical Social Worker

## 2022-10-14 NOTE — Progress Notes (Signed)
Virtual Visit via Video Note  I connected with Dawn Crosby on 10/14/22 at 12:30 PM EDT by a video enabled telemedicine application and verified that I am speaking with the correct person using two identifiers.  Location: Patient: home Provider: home office   I discussed the limitations of evaluation and management by telemedicine and the availability of in person appointments. The patient expressed understanding and agreed to proceed.   I discussed the assessment and treatment plan with the patient. The patient was provided an opportunity to ask questions and all were answered. The patient agreed with the plan and demonstrated an understanding of the instructions.   The patient was advised to call back or seek an in-person evaluation if the symptoms worsen or if the condition fails to improve as anticipated.  I provided 55 minutes of non-face-to-face time during this encounter.   [name], LCSW   THERAPIST PROGRESS NOTE  Session Time: 12:30pm-1:25pm  Participation Level: Active  Behavioral Response: NeatAlertDepressed and Euthymic  Type of Therapy: Individual Therapy  Treatment Goals addressed: "coping with stress, transitions, social anxiety". Dawn Crosby will improve her anxiety 5 out of 7 days as evidenced by reduction of sxs, ability to use coping skills, and increased confidence in herself and her decisions.    ProgressTowards Goals: Progressing  Interventions: CBT  Summary: Dawn Crosby is a 22 y.o. female who presents with Bipolar I depressed.   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 Clinician utilized CBT to process thoughts, feelings, and behaviors. Dawn Crosby shared updates since last session, noting many changes upcoming. Dawn Crosby identified increased stress, but also motivation to take some control over her life. Clinician reflected the increased ability to take control  over small things, such as work, relationships with others, and her own financial ability to make choices. Clinician noted ongoing grief over loss of relationship with boyfriend, especially as she learns more and more that he was planning to propose at the end of the year. Clinician reflected the impact of this information and also noted the sense of avoiding big problems, since his attitude toward her has been really bad. Clinician explored the value of being single, going out with her girls and being able to do what she wants without having to worry about making someone jealous or hurting his feelings. Clinician reflected the increased sense of responsibility for herself, noting that she is planning to return to school for [employer].   Plan: Return again in 2 weeks.  Diagnosis: Bipolar I disorder (HCC)  Collaboration of Care: Patient refused AEB none required  Patient/Guardian was advised Release of Information must be obtained prior to any record release in order to collaborate their care with an outside provider. Patient/Guardian was advised if they have not already done so to contact the registration department to sign all necessary forms in order for [place] to release information regarding their care.   Consent: Patient/Guardian gives verbal consent for treatment and assignment of benefits for services provided during this visit. Patient/Guardian expressed understanding and agreed to proceed.   Chryl Heck [place], LCSW 10/14/2022

## 2022-10-26 ENCOUNTER — Ambulatory Visit (INDEPENDENT_AMBULATORY_CARE_PROVIDER_SITE_OTHER): Payer: 59 | Admitting: Licensed Clinical Social Worker

## 2022-10-26 DIAGNOSIS — F319 Bipolar disorder, unspecified: Secondary | ICD-10-CM

## 2022-10-31 ENCOUNTER — Encounter (HOSPITAL_COMMUNITY): Payer: Self-pay | Admitting: Licensed Clinical Social Worker

## 2022-10-31 NOTE — Progress Notes (Addendum)
Virtual Visit via Video Note  I connected with Dawn Crosby on 11/23/22 at  2:30 PM EDT by a video enabled telemedicine application and verified that I am speaking with the correct person using two identifiers.  Location: Patient: home Provider: home office   I discussed the limitations of evaluation and management by telemedicine and the availability of in person appointments. The patient expressed understanding and agreed to proceed.   I discussed the assessment and treatment plan with the patient. The patient was provided an opportunity to ask questions and all were answered. The patient agreed with the plan and demonstrated an understanding of the instructions.   The patient was advised to call back or seek an in-person evaluation if the symptoms worsen or if the condition fails to improve as anticipated.  I provided 55 minutes of non-face-to-face time during this encounter.   [name], LCSW   THERAPIST PROGRESS NOTE  Session Time: 2:30pm-3:25pm  Participation Level: Active  Behavioral Response: NeatAlertAnxious and Irritable  Type of Therapy: Individual Therapy  Treatment Goals addressed:  "coping with stress, transitions, social anxiety". Dawn Crosby will improve her anxiety 5 out of 7 days as evidenced by reduction of sxs, ability to use coping skills, and increased confidence in herself and her decisions   ProgressTowards Goals: Progressing  Interventions: CBT  Summary: Dawn Crosby is a 22 y.o. female who presents with Bipolar I disorder.   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 Clinician utilized CBT to process thoughts, feelings, and interactions at home, work, and with friends. Clinician noted that lack of natural supports have been making this time more challenging. Clinician processed communication and relationships with co-workers. Dawn Crosby noted that while in the  relationship with ex over the past year, she has lost contact with many friends. Clinician explored ways to either reconnect with these people or to get out and meet new friends. Dawn Crosby shared some thoughts about returning to school. Clinician explored changes in her focus, as she will be moving at the end of the month and has to clear out of her house, find housing for her animals, and figure out her next steps. Clinician explored coping skills and noted that she is doing pretty well with being consistent on medication. Clinician encouraged Dawn Crosby to increase socialization, focus on what is happening in the here and now, and ensure she has positive self-talk.   Plan: Return again in 2 weeks.  Diagnosis: Bipolar I disorder (HCC)  Collaboration of Care: Patient refused AEB none required  Patient/Guardian was advised Release of Information must be obtained prior to any record release in order to collaborate their care with an outside provider. Patient/Guardian was advised if they have not already done so to contact the registration department to sign all necessary forms in order for [place] to release information regarding their care.   Consent: Patient/Guardian gives verbal consent for treatment and assignment of benefits for services provided during this visit. Patient/Guardian expressed understanding and agreed to proceed.   Chryl Heck [place], LCSW 10/31/2022

## 2022-11-04 ENCOUNTER — Encounter: Payer: Self-pay | Admitting: Adult Health

## 2022-11-04 ENCOUNTER — Other Ambulatory Visit (HOSPITAL_COMMUNITY): Payer: Self-pay

## 2022-11-04 ENCOUNTER — Ambulatory Visit (INDEPENDENT_AMBULATORY_CARE_PROVIDER_SITE_OTHER): Payer: 59 | Admitting: Adult Health

## 2022-11-04 DIAGNOSIS — F319 Bipolar disorder, unspecified: Secondary | ICD-10-CM | POA: Diagnosis not present

## 2022-11-04 DIAGNOSIS — F4010 Social phobia, unspecified: Secondary | ICD-10-CM | POA: Diagnosis not present

## 2022-11-04 DIAGNOSIS — F900 Attention-deficit hyperactivity disorder, predominantly inattentive type: Secondary | ICD-10-CM | POA: Diagnosis not present

## 2022-11-04 MED ORDER — AMPHETAMINE-DEXTROAMPHETAMINE 20 MG PO TABS
20.0000 mg | ORAL_TABLET | Freq: Two times a day (BID) | ORAL | 0 refills | Status: DC
Start: 2022-12-30 — End: 2023-02-03

## 2022-11-04 MED ORDER — AMPHETAMINE-DEXTROAMPHETAMINE 20 MG PO TABS
20.0000 mg | ORAL_TABLET | Freq: Two times a day (BID) | ORAL | 0 refills | Status: DC
Start: 2022-12-02 — End: 2023-02-03

## 2022-11-04 MED ORDER — AMPHETAMINE-DEXTROAMPHETAMINE 20 MG PO TABS
20.0000 mg | ORAL_TABLET | Freq: Two times a day (BID) | ORAL | 0 refills | Status: DC
Start: 2022-11-04 — End: 2022-12-28
  Filled 2022-11-04: qty 60, 30d supply, fill #0

## 2022-11-04 NOTE — Progress Notes (Signed)
Dawn Crosby [id number] 2001/04/05 22 y.o.  Subjective:   Patient ID:  Dawn Crosby is a 22 y.o. (DOB 01-31-2001) female.  Chief Complaint: No chief complaint on file.   HPI Dawn Crosby presents to the office today for follow-up of Bipolar disorder, ADHD, SAD, Social phobia.  Describes mood today as "ok". Pleasant. Tearful at times. Mood symptoms - reports decreased depression,  anxiety, and irritability. Reports decreased worry, rumination, and over thinking. Mood has improved. Reports decreased situational stressors. Stating "I feel like I'm doing better. Feels like current medications are helpful.  Stable interest and motivation. Taking medications as prescribed.  Energy levels lower. Active, does not have a regular exercise routine. Walking.  Enjoys some usual interests and activities. Single. Living at home. Spending time with family. Appetite adequate. Weight stable - 240 pounds. Sleeps well most nights. Averages 6 to 8 or more hours. Focus and concentration stable. Completing tasks. Managing aspects of household. 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 Denies SI or HI.  Denies AH or VH. Denies self harm - 2 years. Denies substance use. Seeing therapist.    [phone]    Flowsheet Row Office Visit from 04/26/2022 in [facility] Total Score 0      Flowsheet Row ED from 08/31/2020 in [facility] at [facility] RISK CATEGORY No Risk        Review of Systems:  Review of Systems  Musculoskeletal:  Negative for gait problem.  Neurological:  Negative for tremors.  Psychiatric/Behavioral:         Please refer to HPI    Medications: I have reviewed the patient's current medications.  Current Outpatient Medications  Medication Sig Dispense Refill   amphetamine-dextroamphetamine (ADDERALL) 20 MG tablet TAKE 1 TABLET BY MOUTH 2 TIMES DAILY FOR ADD 60 tablet 0   [START ON 12/02/2022]  amphetamine-dextroamphetamine (ADDERALL) 20 MG tablet Take 1 tablet (20 mg total) by mouth 2 (two) times daily. 60 tablet 0   [START ON 12/30/2022] amphetamine-dextroamphetamine (ADDERALL) 20 MG tablet Take 1 tablet (20 mg total) by mouth 2 (two) times daily. 60 tablet 0   azithromycin (ZITHROMAX) 500 MG tablet Take 1 tablet (500 mg total) by mouth daily for 3 days 3 tablet 0   buPROPion (WELLBUTRIN XL) 300 MG 24 hr tablet TAKE 1 TABLET BY MOUTH ONCE DAILY 90 tablet 3   busPIRone (BUSPAR) 10 MG tablet Take one tablet by mouth twice daily. 180 tablet 3   cetirizine (ZYRTEC) 10 MG tablet Take 10 mg by mouth at bedtime.     clonazePAM (KLONOPIN) 0.5 MG tablet Take 1 tablet (0.5 mg total) by mouth daily as needed for anxiety or panic 30 tablet 2   co-enzyme Q-10 30 MG capsule Take 30 mg by mouth daily.     diphenhydrAMINE (BENADRYL) 25 mg capsule Take 1 each (25 mg total) by mouth every 6 (six) hours as needed for allergies for up to 5 days. 12 capsule 0   docusate sodium (COLACE) 100 MG capsule Take 100 mg by mouth at bedtime.     ibuprofen (ADVIL) 800 MG tablet Take 1 tablet by mouth every 8 hours with food 30 tablet 0   lamoTRIgine (LAMICTAL) 200 MG tablet TAKE 1 TABLET BY MOUTH ONCE A DAY 90 tablet 3   lamoTRIgine (LAMICTAL) 25 MG tablet Take 2 tablets by mouth at bedtime. 180 tablet 1   melatonin 5 MG TABS Take 5 mg  by mouth at bedtime.     Multiple Vitamin (MULTIVITAMIN) capsule Take 1 capsule by mouth daily.     Omega-3 1000 MG CAPS Take 1 capsule by mouth at bedtime.     QUEtiapine (SEROQUEL) 25 MG tablet Take 1 tablet (25 mg total) by mouth at bedtime. 90 tablet 3   No current facility-administered medications for this visit.    Medication Side Effects: None  Allergies:  Allergies  Allergen Reactions   Bactrim [Sulfamethoxazole-Trimethoprim] Hives    Possible hives; pt was on bactrim and pcn, unsure which caused hives   Clindamycin/Lincomycin Hives   Kiwi Extract Other (See Comments)     Tongue tingling  Other reaction(s): tongue tingle   Penicillins Hives    Possible hives; pt was on bactrim and pcn, unsure which caused hives   Pineapple Other (See Comments)    Tongue allergy  Other reaction(s): tongue tingle   Prozac [Fluoxetine Hcl] Hives   Adhesive [Tape] Rash   Clindamycin Rash   Fluoxetine Rash    Other reaction(s): hives, itching    Past Medical History:  Diagnosis Date   ADHD    Anxiety    Asthma    as child    Bipolar disorder (HCC)    Depression     Past Medical History, Surgical history, Social history, and Family history were reviewed and updated as appropriate.   Please see review of systems for further details on the patient's review from today.   Objective:   Physical Exam:  There were no vitals taken for this visit.  Physical Exam Constitutional:      General: She is not in acute distress. Musculoskeletal:        General: No deformity.  Neurological:     Mental Status: She is alert and oriented to person, place, and time.     Coordination: Coordination normal.  Psychiatric:        Attention and Perception: Attention and perception normal. She does not perceive auditory or visual hallucinations.        Mood and Affect: Mood normal. Mood is not anxious or depressed. Affect is not labile, blunt, angry or inappropriate.        Speech: Speech normal.        Behavior: Behavior normal.        Thought Content: Thought content normal. Thought content is not paranoid or delusional. Thought content does not include homicidal or suicidal ideation. Thought content does not include homicidal or suicidal plan.        Cognition and Memory: Cognition and memory normal.        Judgment: Judgment normal.     Comments: Insight intact     Lab Review:     Component Value Date/Time   NA 139 04/01/2021 1143   K 3.9 04/01/2021 1143   CL 104 04/01/2021 1143   CO2 28 04/01/2021 1143   GLUCOSE 87 04/01/2021 1143   BUN 12 04/01/2021 1143    CREATININE 0.91 04/01/2021 1143   CREATININE 0.69 12/31/2015 0001   CALCIUM 9.6 04/01/2021 1143   PROT 7.1 04/01/2021 1143   ALBUMIN 4.4 04/01/2021 1143   AST 14 04/01/2021 1143   ALT 13 04/01/2021 1143   ALKPHOS 70 04/01/2021 1143   BILITOT 0.3 04/01/2021 1143       Component Value Date/Time   WBC 7.8 04/01/2021 1143   RBC 4.41 04/01/2021 1143   HGB 13.0 04/01/2021 1143   HCT 38.3 04/01/2021 1143   PLT 335.0 04/01/2021  1143   MCV 86.9 04/01/2021 1143   MCHC 34.1 04/01/2021 1143   RDW 12.5 04/01/2021 1143    No results found for: "POCLITH", "LITHIUM"   No results found for: "PHENYTOIN", "PHENOBARB", "VALPROATE", "CBMZ"   .res Assessment: Plan:    Plan:  PDMP reviewed  Adderall 20mg  BID Buspar 10mg  BID Lamictal 200mg  daily Lamictal 50mg  daily Wellbutrin XL 300mg  in the am  Seroquel 100mg  - 1/2 to one tablet as needed Clonazepam 0.5mg  as needed for anxiety  3 months  Discussed potential benefits, risk, and side effects of benzodiazepines to include potential risk of tolerance and dependence, as well as possible drowsiness.  Advised patient not to drive if experiencing drowsiness and to take lowest possible effective dose to minimize risk of dependence and tolerance.   Discussed potential benefits, risks, and side effects of stimulants with patient to include increased heart rate, palpitations, insomnia, increased anxiety, increased irritability, or decreased appetite.  Instructed patient to contact office if experiencing any significant tolerability issues.   Discussed potential metabolic side effects associated with atypical antipsychotics, as well as potential risk for movement side effects. Advised pt to contact office if movement side effects occur.    Counseled patient regarding potential benefits, risks, and side effects of Lamictal to include potential risk of Stevens-Johnson syndrome. Advised patient to stop taking Lamictal and contact office immediately if  rash develops and to seek urgent medical attention if rash is severe and/or spreading quickly.   Diagnoses and all orders for this visit:  Bipolar I disorder (HCC)  ADHD (attention deficit hyperactivity disorder), inattentive type -     amphetamine-dextroamphetamine (ADDERALL) 20 MG tablet; TAKE 1 TABLET BY MOUTH 2 TIMES DAILY FOR ADD -     amphetamine-dextroamphetamine (ADDERALL) 20 MG tablet; Take 1 tablet (20 mg total) by mouth 2 (two) times daily. -     amphetamine-dextroamphetamine (ADDERALL) 20 MG tablet; Take 1 tablet (20 mg total) by mouth 2 (two) times daily.  Social anxiety disorder  Social phobia     Please see After Visit Summary for patient specific instructions.  Future Appointments  Date Time Provider Department Center  11/09/2022 12:30 PM Maia Plan, Chryl Heck, LCSW [id number] None    No orders of the defined types were placed in this encounter.   -------------------------------

## 2022-11-09 ENCOUNTER — Ambulatory Visit (HOSPITAL_COMMUNITY): Payer: 59 | Admitting: Licensed Clinical Social Worker

## 2022-11-10 ENCOUNTER — Ambulatory Visit (HOSPITAL_COMMUNITY): Payer: 59 | Admitting: Licensed Clinical Social Worker

## 2022-11-22 ENCOUNTER — Ambulatory Visit (INDEPENDENT_AMBULATORY_CARE_PROVIDER_SITE_OTHER): Payer: 59 | Admitting: Licensed Clinical Social Worker

## 2022-11-22 DIAGNOSIS — F319 Bipolar disorder, unspecified: Secondary | ICD-10-CM | POA: Diagnosis not present

## 2022-11-23 ENCOUNTER — Encounter (HOSPITAL_COMMUNITY): Payer: Self-pay | Admitting: Licensed Clinical Social Worker

## 2022-11-23 NOTE — Progress Notes (Signed)
Virtual Visit via Video Note  I connected with Dawn Crosby on 11/23/22 at  4:30 PM EDT by a video enabled telemedicine application and verified that I am speaking with the correct person using two identifiers.  Location: Patient: home Provider: home office   I discussed the limitations of evaluation and management by telemedicine and the availability of in person appointments. The patient expressed understanding and agreed to proceed.   I discussed the assessment and treatment plan with the patient. The patient was provided an opportunity to ask questions and all were answered. The patient agreed with the plan and demonstrated an understanding of the instructions.   The patient was advised to call back or seek an in-person evaluation if the symptoms worsen or if the condition fails to improve as anticipated.  I provided 60 minutes of non-face-to-face time during this encounter.   [name], LCSW   THERAPIST PROGRESS NOTE  Session Time: 4:30pm-5:30pm  Participation Level: Active  Behavioral Response: Well GroomedAlertEuthymic  Type of Therapy: Individual Therapy  Treatment Goals addressed:  "coping with stress, transitions, social anxiety". Dawn Crosby will improve her anxiety 5 out of 7 days as evidenced by reduction of sxs, ability to use coping skills, and increased confidence in herself and her decisions   ProgressTowards Goals: Progressing  Interventions: CBT  Summary: Dawn Crosby is a 22 y.o. female who presents with Bipolar 1 Disorder.   Suicidal/Homicidal: Nowithout intent/plan  Therapist Response: Dawn Crosby engaged well in virtual session with Facilities manager. Clinician utilized CBT to process thoughts, feelings, and behaviors. Dawn Crosby shared improvement in mood and self esteem over the past few weeks. She provided updates in ways she has been able to move on after her break up. Clinician identified the changes in attitude about herself and her ability to make  decisions that are more logical, rather than allowing her emotions to lead the way. Clinician processed relationship with the younger sister of ex-boyfriend, noting that she could have been "catty", but decided to be sensitive because it was the right thing to do, rather than to be vengeful. Clinician noted that this was a good sign of healing. Clinician processed ways to continue making decisions based in logic, rather than her emotions taking over.   Plan: Return again in 3 weeks.  Diagnosis: Bipolar I disorder (HCC)  Collaboration of Care: Patient refused AEB none required  Patient/Guardian was advised Release of Information must be obtained prior to any record release in order to collaborate their care with an outside provider. Patient/Guardian was advised if they have not already done so to contact the registration department to sign all necessary forms in order for [place] to release information regarding their care.   Consent: Patient/Guardian gives verbal consent for treatment and assignment of benefits for services provided during this visit. Patient/Guardian expressed understanding and agreed to proceed.   Chryl Heck [place], LCSW 11/23/2022

## 2022-11-24 ENCOUNTER — Other Ambulatory Visit (HOSPITAL_COMMUNITY): Payer: Self-pay

## 2022-11-25 ENCOUNTER — Telehealth: Payer: Self-pay

## 2022-11-25 NOTE — Telephone Encounter (Signed)
LVM for patient to call back [phone], or to call PCP office to schedule follow up apt. AS, CMA  

## 2022-12-09 ENCOUNTER — Other Ambulatory Visit (HOSPITAL_COMMUNITY): Payer: Self-pay

## 2022-12-14 ENCOUNTER — Ambulatory Visit (INDEPENDENT_AMBULATORY_CARE_PROVIDER_SITE_OTHER): Payer: 59 | Admitting: Licensed Clinical Social Worker

## 2022-12-14 ENCOUNTER — Encounter (HOSPITAL_COMMUNITY): Payer: Self-pay | Admitting: Licensed Clinical Social Worker

## 2022-12-14 DIAGNOSIS — F319 Bipolar disorder, unspecified: Secondary | ICD-10-CM | POA: Diagnosis not present

## 2022-12-14 NOTE — Progress Notes (Signed)
Virtual Visit via Video Note  I connected with Delia Heady on 12/14/22 at  3:30 PM EDT by a video enabled telemedicine application and verified that I am speaking with the correct person using two identifiers.  Location: Patient: home Provider: home office   I discussed the limitations of evaluation and management by telemedicine and the availability of in person appointments. The patient expressed understanding and agreed to proceed.   I discussed the assessment and treatment plan with the patient. The patient was provided an opportunity to ask questions and all were answered. The patient agreed with the plan and demonstrated an understanding of the instructions.   The patient was advised to call back or seek an in-person evaluation if the symptoms worsen or if the condition fails to improve as anticipated.  I provided 55 minutes of non-face-to-face time during this encounter.   [name], LCSW   THERAPIST PROGRESS NOTE  Session Time: 3:30pm-4:25pm  Participation Level: Active  Behavioral Response: Well GroomedAlertAnxious, Depressed, and stressed  Type of Therapy: Individual Therapy  Treatment Goals addressed:   "coping with stress, transitions, social anxiety". [name] improve her anxiety 5 out of 7 days as evidenced by reduction of sxs, ability to use coping skills, and increased confidence in herself and her decisions     ProgressTowards Goals: Progressing  Interventions: CBT  Summary: MILIKA SHERIDAN is a 22 y.o. female who presents with Bipolar I disorder.   Suicidal/Homicidal: Nowithout intent/plan  Therapist Response: [name] engaged well in Armed forces logistics/support/administrative officer with Facilities manager. Clinician processed recent interactions, mood, and thought processes using CBT. Clinician explored use of coping skills when confronted with anxiety triggers. 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 [name] shared coping skills she has  put in place for herself, noting that most of the time, she is able to manage the stress of the environment. However, [name] shared an interaction with a particular customer that was threatening and noted that she was already emotionally exhausted, so she became tearful. Clinician validated that experience and normalized her reaction due to her own mood and stress levels at the time. Clinician discussed boundary setting and noted that she has cut ties with ex-boyfriend's sister and mother following an incident where they tried to sabotage her new relationship. Clinician provided time and space for [name] to do her own problem solving in order to process and decide how to set appropriate boundaries.   Plan: Return again in 2 weeks.  Diagnosis: Bipolar I disorder (HCC)  Collaboration of Care: Patient refused AEB none required  Patient/Guardian was advised Release of Information must be obtained prior to any record release in order to collaborate their care with an outside provider. Patient/Guardian was advised if they have not already done so to contact the registration department to sign all necessary forms in order for [place] to release information regarding their care.   Consent: Patient/Guardian gives verbal consent for treatment and assignment of benefits for services provided during this visit. Patient/Guardian expressed understanding and agreed to proceed.   Chryl Heck [place], LCSW 12/14/2022

## 2022-12-21 ENCOUNTER — Other Ambulatory Visit (HOSPITAL_COMMUNITY): Payer: Self-pay

## 2022-12-28 ENCOUNTER — Other Ambulatory Visit: Payer: Self-pay | Admitting: Adult Health

## 2022-12-28 DIAGNOSIS — F900 Attention-deficit hyperactivity disorder, predominantly inattentive type: Secondary | ICD-10-CM

## 2022-12-29 ENCOUNTER — Ambulatory Visit (INDEPENDENT_AMBULATORY_CARE_PROVIDER_SITE_OTHER): Payer: 59 | Admitting: Licensed Clinical Social Worker

## 2022-12-29 ENCOUNTER — Other Ambulatory Visit (HOSPITAL_COMMUNITY): Payer: Self-pay

## 2022-12-29 ENCOUNTER — Encounter (HOSPITAL_COMMUNITY): Payer: Self-pay | Admitting: Licensed Clinical Social Worker

## 2022-12-29 DIAGNOSIS — F319 Bipolar disorder, unspecified: Secondary | ICD-10-CM | POA: Diagnosis not present

## 2022-12-29 MED ORDER — AMPHETAMINE-DEXTROAMPHETAMINE 20 MG PO TABS
20.0000 mg | ORAL_TABLET | Freq: Two times a day (BID) | ORAL | 0 refills | Status: DC
Start: 2022-12-29 — End: 2023-02-03
  Filled 2022-12-29: qty 60, 30d supply, fill #0

## 2022-12-29 MED ORDER — AMPHETAMINE-DEXTROAMPHETAMINE 20 MG PO TABS
20.0000 mg | ORAL_TABLET | Freq: Two times a day (BID) | ORAL | 0 refills | Status: DC
Start: 2023-01-25 — End: 2023-05-05
  Filled 2023-01-31: qty 60, 30d supply, fill #0

## 2022-12-29 NOTE — Progress Notes (Signed)
Virtual Visit via Video Note  I connected with Delia Heady on 12/29/22 at 12:30 PM EDT by a video enabled telemedicine application and verified that I am speaking with the correct person using two identifiers.  Location: Patient: home Provider: home office   I discussed the limitations of evaluation and management by telemedicine and the availability of in person appointments. The patient expressed understanding and agreed to proceed.   I discussed the assessment and treatment plan with the patient. The patient was provided an opportunity to ask questions and all were answered. The patient agreed with the plan and demonstrated an understanding of the instructions.   The patient was advised to call back or seek an in-person evaluation if the symptoms worsen or if the condition fails to improve as anticipated.  I provided 55 minutes of non-face-to-face time during this encounter.   [name], LCSW   THERAPIST PROGRESS NOTE  Session Time: 12:30pm-1:25pm  Participation Level: Active  Behavioral Response: Well GroomedAlertEuthymic  Type of Therapy: Individual Therapy  Treatment Goals addressed: "coping with stress, transitions, social anxiety". [name] improve her anxiety 5 out of 7 days as evidenced by reduction of sxs, ability to use coping skills, and increased confidence in herself and her decisions   ProgressTowards Goals: Progressing  Interventions: CBT  Summary: OREAL TIKKANEN is a 22 y.o. female who presents with Bipolar I.   Suicidal/Homicidal: Nowithout intent/plan  Therapist Response: [name] engaged well in virtual session with Facilities manager. Clinician utilized CBT to process thoughts, feelings, and behaviors. 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 Clinician processed experience of having the interview with the district manager and her ability to communicate well and share her enthusiasm about her  job. Clinician reflected the increased self-confidence and improvement in her maturity since getting a job. Clinician also identified that [name] continues to weed out the people in her life who are not helpful or supportive. Clinician addressed feelings about ex-boyfriend and noted his role as Public affairs consultant", that is only in your life for a period of time and will make a substantial difference in the choices she makes in the future.   Plan: Return again in 3 weeks.  Diagnosis: Bipolar I disorder (HCC)  Collaboration of Care: Patient refused AEB none required  Patient/Guardian was advised Release of Information must be obtained prior to any record release in order to collaborate their care with an outside provider. Patient/Guardian was advised if they have not already done so to contact the registration department to sign all necessary forms in order for [place] to release information regarding their care.   Consent: Patient/Guardian gives verbal consent for treatment and assignment of benefits for services provided during this visit. Patient/Guardian expressed understanding and agreed to proceed.   Chryl Heck [place], LCSW 12/29/2022

## 2023-01-19 ENCOUNTER — Ambulatory Visit (INDEPENDENT_AMBULATORY_CARE_PROVIDER_SITE_OTHER): Payer: 59 | Admitting: Licensed Clinical Social Worker

## 2023-01-19 DIAGNOSIS — F319 Bipolar disorder, unspecified: Secondary | ICD-10-CM | POA: Diagnosis not present

## 2023-01-31 ENCOUNTER — Other Ambulatory Visit (HOSPITAL_COMMUNITY): Payer: Self-pay

## 2023-02-03 ENCOUNTER — Other Ambulatory Visit (HOSPITAL_COMMUNITY): Payer: Self-pay

## 2023-02-03 ENCOUNTER — Ambulatory Visit: Payer: 59 | Admitting: Adult Health

## 2023-02-03 ENCOUNTER — Encounter: Payer: Self-pay | Admitting: Adult Health

## 2023-02-03 DIAGNOSIS — F900 Attention-deficit hyperactivity disorder, predominantly inattentive type: Secondary | ICD-10-CM | POA: Diagnosis not present

## 2023-02-03 DIAGNOSIS — F319 Bipolar disorder, unspecified: Secondary | ICD-10-CM

## 2023-02-03 DIAGNOSIS — F4010 Social phobia, unspecified: Secondary | ICD-10-CM

## 2023-02-03 MED ORDER — AMPHETAMINE-DEXTROAMPHETAMINE 20 MG PO TABS: 20.0000 mg | ORAL_TABLET | Freq: Two times a day (BID) | ORAL | 0 refills | Status: DC

## 2023-02-03 MED ORDER — LAMOTRIGINE 25 MG PO TABS
50.0000 mg | ORAL_TABLET | Freq: Every evening | ORAL | 1 refills | Status: DC
  Filled 2023-02-03: qty 103, 51d supply, fill #0
  Filled 2023-02-03: qty 77, 39d supply, fill #0

## 2023-02-03 MED ORDER — BUSPIRONE HCL 10 MG PO TABS
10.0000 mg | ORAL_TABLET | Freq: Two times a day (BID) | ORAL | 3 refills | Status: DC
Start: 2023-02-03 — End: 2024-03-27
  Filled 2023-02-03 – 2023-02-04 (×2): qty 180, 90d supply, fill #0
  Filled 2023-12-03: qty 180, 90d supply, fill #1

## 2023-02-03 MED ORDER — CLONAZEPAM 0.5 MG PO TABS
0.5000 mg | ORAL_TABLET | Freq: Every day | ORAL | 2 refills | Status: DC | PRN
  Filled 2023-02-03: qty 30, 30d supply, fill #0

## 2023-02-03 MED ORDER — BUPROPION HCL ER (XL) 300 MG PO TB24
ORAL_TABLET | Freq: Every day | ORAL | 3 refills | Status: DC
Start: 2023-02-03 — End: 2024-03-27
  Filled 2023-02-03: qty 90, fill #0
  Filled 2023-05-10: qty 90, 90d supply, fill #0
  Filled 2023-09-06: qty 90, 90d supply, fill #1

## 2023-02-03 MED ORDER — QUETIAPINE FUMARATE 25 MG PO TABS
25.0000 mg | ORAL_TABLET | Freq: Every day | ORAL | 3 refills | Status: DC
  Filled 2023-02-03: qty 90, 90d supply, fill #0

## 2023-02-03 MED ORDER — AMPHETAMINE-DEXTROAMPHETAMINE 20 MG PO TABS
20.0000 mg | ORAL_TABLET | Freq: Two times a day (BID) | ORAL | 0 refills | Status: DC
Start: 2023-02-03 — End: 2023-05-05
  Filled 2023-02-03 – 2023-03-10 (×2): qty 60, 30d supply, fill #0

## 2023-02-03 MED ORDER — LAMOTRIGINE 200 MG PO TABS
200.0000 mg | ORAL_TABLET | Freq: Every day | ORAL | 3 refills | Status: DC
Start: 2023-02-03 — End: 2024-03-19
  Filled 2023-02-03: qty 90, 90d supply, fill #0
  Filled 2023-06-14: qty 90, 90d supply, fill #1
  Filled 2023-10-09: qty 90, 90d supply, fill #2

## 2023-02-03 NOTE — Progress Notes (Signed)
Dawn Crosby [id number] Apr 27, 2001 22 y.o.  Subjective:   Patient ID:  Dawn Crosby is a 22 y.o. (DOB 03-11-2001) female.  Chief Complaint: No chief complaint on file.   HPI Dawn Crosby presents to the office today for follow-up of Bipolar disorder, ADHD, SAD, Social phobia.  Describes mood today as "ok". Pleasant. Tearful at times. Mood symptoms - reports some depression,  anxiety, and irritability - "an appropriate amount". Reports one recent panic attacks. Denies worry, rumination, and over thinking. Mood has improved. Stating "I feel like I'm doing ok. Feels like current medications are helpful.  Stable interest and motivation. Taking medications as prescribed.  Energy levels improved. Active, does not have a regular exercise routine. Walking.  Enjoys some usual interests and activities. Single. Living at home. Spending time with family. Appetite adequate. Weight stable - 240 pounds. Sleeps well most nights. Averages 6 to 8 hours. Focus and concentration stable. Completing tasks. Managing aspects of household. Working in Engineering geologist - recent promotion. Plans to return to school. Denies SI or HI.  Denies AH or VH. Denies self harm. Denies substance use. Seeing therapist.     [phone]    Flowsheet Row Office Visit from 04/26/2022 in [facility] Total Score 0      Flowsheet Row ED from 08/31/2020 in [facility] RISK CATEGORY No Risk        Review of Systems:  Review of Systems  Musculoskeletal:  Negative for gait problem.  Neurological:  Negative for tremors.  Psychiatric/Behavioral:         Please refer to HPI    Medications: I have reviewed the patient's current medications.  Current Outpatient Medications  Medication Sig Dispense Refill   amphetamine-dextroamphetamine (ADDERALL) 20 MG tablet Take 1 tablet (20 mg total) by mouth 2 (two) times daily. 60 tablet 0    amphetamine-dextroamphetamine (ADDERALL) 20 MG tablet Take 1 tablet (20 mg total) by mouth 2 (two) times daily. 60 tablet 0   [START ON 03/03/2023] amphetamine-dextroamphetamine (ADDERALL) 20 MG tablet Take 1 tablet (20 mg total) by mouth 2 (two) times daily. 60 tablet 0   [START ON 03/31/2023] amphetamine-dextroamphetamine (ADDERALL) 20 MG tablet Take 1 tablet (20 mg) by mouth 2 times daily for ADD 60 tablet 0   azithromycin (ZITHROMAX) 500 MG tablet Take 1 tablet (500 mg total) by mouth daily for 3 days 3 tablet 0   buPROPion (WELLBUTRIN XL) 300 MG 24 hr tablet TAKE 1 TABLET BY MOUTH ONCE DAILY 90 tablet 3   busPIRone (BUSPAR) 10 MG tablet Take one tablet by mouth twice daily. 180 tablet 3   cetirizine (ZYRTEC) 10 MG tablet Take 10 mg by mouth at bedtime.     clonazePAM (KLONOPIN) 0.5 MG tablet Take 1 tablet (0.5 mg total) by mouth daily as needed for anxiety or panic 30 tablet 2   co-enzyme Q-10 30 MG capsule Take 30 mg by mouth daily.     diphenhydrAMINE (BENADRYL) 25 mg capsule Take 1 each (25 mg total) by mouth every 6 (six) hours as needed for allergies for up to 5 days. 12 capsule 0   docusate sodium (COLACE) 100 MG capsule Take 100 mg by mouth at bedtime.     ibuprofen (ADVIL) 800 MG tablet Take 1 tablet by mouth every 8 hours with food 30 tablet 0   lamoTRIgine (LAMICTAL) 200 MG tablet TAKE 1 TABLET BY MOUTH ONCE A DAY 90 tablet  3   lamoTRIgine (LAMICTAL) 25 MG tablet Take 2 tablets by mouth at bedtime. 180 tablet 1   melatonin 5 MG TABS Take 5 mg by mouth at bedtime.     Multiple Vitamin (MULTIVITAMIN) capsule Take 1 capsule by mouth daily.     Omega-3 1000 MG CAPS Take 1 capsule by mouth at bedtime.     QUEtiapine (SEROQUEL) 25 MG tablet Take 1 tablet (25 mg total) by mouth at bedtime. 90 tablet 3   No current facility-administered medications for this visit.    Medication Side Effects: None  Allergies:  Allergies  Allergen Reactions   Bactrim [Sulfamethoxazole-Trimethoprim]  Hives    Possible hives; pt was on bactrim and pcn, unsure which caused hives   Clindamycin/Lincomycin Hives   Kiwi Extract Other (See Comments)    Tongue tingling  Other reaction(s): tongue tingle   Penicillins Hives    Possible hives; pt was on bactrim and pcn, unsure which caused hives   Pineapple Other (See Comments)    Tongue allergy  Other reaction(s): tongue tingle   Prozac [Fluoxetine Hcl] Hives   Adhesive [Tape] Rash   Clindamycin Rash   Fluoxetine Rash    Other reaction(s): hives, itching    Past Medical History:  Diagnosis Date   ADHD    Anxiety    Asthma    as child    Bipolar disorder (HCC)    Depression     Past Medical History, Surgical history, Social history, and Family history were reviewed and updated as appropriate.   Please see review of systems for further details on the patient's review from today.   Objective:   Physical Exam:  There were no vitals taken for this visit.  Physical Exam Constitutional:      General: She is not in acute distress. Musculoskeletal:        General: No deformity.  Neurological:     Mental Status: She is alert and oriented to person, place, and time.     Coordination: Coordination normal.  Psychiatric:        Attention and Perception: Attention and perception normal. She does not perceive auditory or visual hallucinations.        Mood and Affect: Mood normal. Mood is not anxious or depressed. Affect is not labile, blunt, angry or inappropriate.        Speech: Speech normal.        Behavior: Behavior normal.        Thought Content: Thought content normal. Thought content is not paranoid or delusional. Thought content does not include homicidal or suicidal ideation. Thought content does not include homicidal or suicidal plan.        Cognition and Memory: Cognition and memory normal.        Judgment: Judgment normal.     Comments: Insight intact     Lab Review:     Component Value Date/Time   NA 139  04/01/2021 1143   K 3.9 04/01/2021 1143   CL 104 04/01/2021 1143   CO2 28 04/01/2021 1143   GLUCOSE 87 04/01/2021 1143   BUN 12 04/01/2021 1143   CREATININE 0.91 04/01/2021 1143   CREATININE 0.69 12/31/2015 0001   CALCIUM 9.6 04/01/2021 1143   PROT 7.1 04/01/2021 1143   ALBUMIN 4.4 04/01/2021 1143   AST 14 04/01/2021 1143   ALT 13 04/01/2021 1143   ALKPHOS 70 04/01/2021 1143   BILITOT 0.3 04/01/2021 1143       Component Value Date/Time  WBC 7.8 04/01/2021 1143   RBC 4.41 04/01/2021 1143   HGB 13.0 04/01/2021 1143   HCT 38.3 04/01/2021 1143   PLT 335.0 04/01/2021 1143   MCV 86.9 04/01/2021 1143   MCHC 34.1 04/01/2021 1143   RDW 12.5 04/01/2021 1143    No results found for: "POCLITH", "LITHIUM"   No results found for: "PHENYTOIN", "PHENOBARB", "VALPROATE", "CBMZ"   .res Assessment: Plan:    Plan:  PDMP reviewed  Adderall 20mg  BID Buspar 10mg  BID Lamictal 200mg  daily Lamictal 50mg  daily Wellbutrin XL 300mg  in the am  Seroquel 100mg  - 1/2 to one tablet as needed Clonazepam 0.5mg  as needed for anxiety  BP 93/61/97  3 months  Discussed potential benefits, risk, and side effects of benzodiazepines to include potential risk of tolerance and dependence, as well as possible drowsiness.  Advised patient not to drive if experiencing drowsiness and to take lowest possible effective dose to minimize risk of dependence and tolerance.   Discussed potential benefits, risks, and side effects of stimulants with patient to include increased heart rate, palpitations, insomnia, increased anxiety, increased irritability, or decreased appetite.  Instructed patient to contact office if experiencing any significant tolerability issues.   Discussed potential metabolic side effects associated with atypical antipsychotics, as well as potential risk for movement side effects. Advised pt to contact office if movement side effects occur.    Counseled patient regarding potential benefits,  risks, and side effects of Lamictal to include potential risk of Stevens-Johnson syndrome. Advised patient to stop taking Lamictal and contact office immediately if rash develops and to seek urgent medical attention if rash is severe and/or spreading quickly.  Diagnoses and all orders for this visit:  Bipolar I disorder (HCC) -     lamoTRIgine (LAMICTAL) 200 MG tablet; TAKE 1 TABLET BY MOUTH ONCE A DAY -     lamoTRIgine (LAMICTAL) 25 MG tablet; Take 2 tablets by mouth at bedtime. -     QUEtiapine (SEROQUEL) 25 MG tablet; Take 1 tablet (25 mg total) by mouth at bedtime.  Bipolar 1 disorder (HCC) -     buPROPion (WELLBUTRIN XL) 300 MG 24 hr tablet; TAKE 1 TABLET BY MOUTH ONCE DAILY  Social anxiety disorder -     busPIRone (BUSPAR) 10 MG tablet; Take one tablet by mouth twice daily. -     clonazePAM (KLONOPIN) 0.5 MG tablet; Take 1 tablet (0.5 mg total) by mouth daily as needed for anxiety or panic  Social phobia -     busPIRone (BUSPAR) 10 MG tablet; Take one tablet by mouth twice daily.  ADHD (attention deficit hyperactivity disorder), inattentive type -     amphetamine-dextroamphetamine (ADDERALL) 20 MG tablet; Take 1 tablet (20 mg total) by mouth 2 (two) times daily. -     amphetamine-dextroamphetamine (ADDERALL) 20 MG tablet; Take 1 tablet (20 mg total) by mouth 2 (two) times daily. -     amphetamine-dextroamphetamine (ADDERALL) 20 MG tablet; Take 1 tablet (20 mg) by mouth 2 times daily for ADD     Please see After Visit Summary for patient specific instructions.  Future Appointments  Date Time Provider Department Center  02/09/2023 12:30 PM Maia Plan, Chryl Heck, LCSW [id number] None    No orders of the defined types were placed in this encounter.   -------------------------------

## 2023-02-04 ENCOUNTER — Other Ambulatory Visit (HOSPITAL_COMMUNITY): Payer: Self-pay

## 2023-02-07 ENCOUNTER — Encounter (HOSPITAL_COMMUNITY): Payer: Self-pay | Admitting: Licensed Clinical Social Worker

## 2023-02-07 NOTE — Progress Notes (Signed)
Virtual Visit via Video Note  I connected with Dawn Crosby on 01/19/23 at 12:30 PM EDT by a video enabled telemedicine application and verified that I am speaking with the correct person using two identifiers.  Location: Patient: home Provider: home office   I discussed the limitations of evaluation and management by telemedicine and the availability of in person appointments. The patient expressed understanding and agreed to proceed.   I discussed the assessment and treatment plan with the patient. The patient was provided an opportunity to ask questions and all were answered. The patient agreed with the plan and demonstrated an understanding of the instructions.   The patient was advised to call back or seek an in-person evaluation if the symptoms worsen or if the condition fails to improve as anticipated.  I provided 55 minutes of non-face-to-face time during this encounter.   Dawn Melter, LCSW   THERAPIST PROGRESS NOTE  Session Time: 12:30pm-1:25pm  Participation Level: Active  Behavioral Response: NeatAlertEuthymic  Type of Therapy: Individual Therapy  Treatment Goals addressed: "coping with stress, transitions, social anxiety". Dawn Crosby will improve her anxiety 5 out of 7 days as evidenced by reduction of sxs, ability to use coping skills, and increased confidence in herself and her decisions   ProgressTowards Goals: Progressing  Interventions: CBT  Summary: Dawn Crosby is a 22 y.o. female who presents with Bipolar I.   Suicidal/Homicidal: Nowithout intent/plan  Therapist Response: Dawn Crosby engaged well in individual virtual session with clinician. Clinician utilized CBT to process thoughts, feelings, and behaviors. Clinician explored interactions with friends and family. Clinician processed updates from work, noting that she is waiting for her promotion and feels excited to be given this opportunity. Clinician discussed current stressors and processed plans to  move some things into storage for when the floors are redone in the house. Clinician explored Dawn Crosby's method for cleaning up, throwing things away and donations.   Plan: Return again in 2 weeks.  Diagnosis: Bipolar I disorder (HCC)  Collaboration of Care: Patient refused AEB none required  Patient/Guardian was advised Release of Information must be obtained prior to any record release in order to collaborate their care with an outside provider. Patient/Guardian was advised if they have not already done so to contact the registration department to sign all necessary forms in order for [place] to release information regarding their care.   Consent: Patient/Guardian gives verbal consent for treatment and assignment of benefits for services provided during this visit. Patient/Guardian expressed understanding and agreed to proceed.   Chryl Heck [place], LCSW 02/07/2023

## 2023-02-09 ENCOUNTER — Ambulatory Visit (HOSPITAL_COMMUNITY): Payer: 59 | Admitting: Licensed Clinical Social Worker

## 2023-02-27 ENCOUNTER — Other Ambulatory Visit (HOSPITAL_COMMUNITY): Payer: Self-pay

## 2023-03-08 DIAGNOSIS — Z1389 Encounter for screening for other disorder: Secondary | ICD-10-CM | POA: Diagnosis not present

## 2023-03-08 DIAGNOSIS — Z124 Encounter for screening for malignant neoplasm of cervix: Secondary | ICD-10-CM | POA: Diagnosis not present

## 2023-03-08 DIAGNOSIS — Z01419 Encounter for gynecological examination (general) (routine) without abnormal findings: Secondary | ICD-10-CM | POA: Diagnosis not present

## 2023-03-08 DIAGNOSIS — Z130 Encounter for screening for diseases of the blood and blood-forming organs and certain disorders involving the immune mechanism: Secondary | ICD-10-CM | POA: Diagnosis not present

## 2023-03-08 DIAGNOSIS — Z30433 Encounter for removal and reinsertion of intrauterine contraceptive device: Secondary | ICD-10-CM | POA: Diagnosis not present

## 2023-03-08 DIAGNOSIS — F909 Attention-deficit hyperactivity disorder, unspecified type: Secondary | ICD-10-CM | POA: Diagnosis not present

## 2023-03-08 DIAGNOSIS — N926 Irregular menstruation, unspecified: Secondary | ICD-10-CM | POA: Diagnosis not present

## 2023-03-08 DIAGNOSIS — F319 Bipolar disorder, unspecified: Secondary | ICD-10-CM | POA: Diagnosis not present

## 2023-03-08 DIAGNOSIS — F418 Other specified anxiety disorders: Secondary | ICD-10-CM | POA: Diagnosis not present

## 2023-03-08 DIAGNOSIS — Z113 Encounter for screening for infections with a predominantly sexual mode of transmission: Secondary | ICD-10-CM | POA: Diagnosis not present

## 2023-03-10 ENCOUNTER — Other Ambulatory Visit (HOSPITAL_COMMUNITY): Payer: Self-pay

## 2023-03-11 ENCOUNTER — Other Ambulatory Visit (HOSPITAL_COMMUNITY): Payer: Self-pay

## 2023-03-14 ENCOUNTER — Ambulatory Visit (INDEPENDENT_AMBULATORY_CARE_PROVIDER_SITE_OTHER): Payer: 59 | Admitting: Licensed Clinical Social Worker

## 2023-03-14 DIAGNOSIS — F319 Bipolar disorder, unspecified: Secondary | ICD-10-CM

## 2023-03-15 ENCOUNTER — Encounter (HOSPITAL_COMMUNITY): Payer: Self-pay | Admitting: Licensed Clinical Social Worker

## 2023-03-15 NOTE — Progress Notes (Signed)
   THERAPIST PROGRESS NOTE  Session Time: 4:30pm-5:30pm  Participation Level: Active  Behavioral Response: Well GroomedAlertDepressed  Type of Therapy: Individual Therapy  Treatment Goals addressed: "coping with stress, transitions, social anxiety". Dawn Crosby will improve her anxiety 5 out of 7 days as evidenced by reduction of sxs, ability to use coping skills, and increased confidence in herself and her decisions   ProgressTowards Goals: Progressing  Interventions: CBT  Summary: Dawn Crosby is a 22 y.o. female who presents with Bipolar I disorder.   Suicidal/Homicidal: Nowithout intent/plan  Therapist Response: Dawn Crosby engaged well in individual in person session with clinician. Clinician utilized CBT to process thoughts, feelings, and behaviors. Clinician processed recent updates in life, including loss of dog, house remodel, and new management position at work. Clinician provided time and space for Dawn Crosby to provide updates and process through these changes. Dawn Crosby shared challenges with having to be the adult. Clinician identified these challenges and noted that like it or not, she is not only responsible for making adult decisions, she actually knows the right decisions to make. Clinician validated and supported Dawn Crosby when talking about having to put her dog to sleep the day before. Mood has been stable, relationship with boyfriend is stable, and work has been going really well.   Plan: Return again in 3-4 weeks.  Diagnosis: Bipolar I disorder (HCC)  Collaboration of Care: Patient refused AEB none required  Patient/Guardian was advised Release of Information must be obtained prior to any record release in order to collaborate their care with an outside provider. Patient/Guardian was advised if they have not already done so to contact the registration department to sign all necessary forms in order for [place] to release information regarding their care.   Consent: Patient/Guardian gives  verbal consent for treatment and assignment of benefits for services provided during this visit. Patient/Guardian expressed understanding and agreed to proceed.   Chryl Heck [place], LCSW 03/15/2023

## 2023-03-20 DIAGNOSIS — N926 Irregular menstruation, unspecified: Secondary | ICD-10-CM | POA: Diagnosis not present

## 2023-03-24 ENCOUNTER — Other Ambulatory Visit (HOSPITAL_COMMUNITY): Payer: Self-pay

## 2023-04-11 ENCOUNTER — Ambulatory Visit (HOSPITAL_COMMUNITY): Payer: 59 | Admitting: Licensed Clinical Social Worker

## 2023-04-14 ENCOUNTER — Other Ambulatory Visit (HOSPITAL_COMMUNITY): Payer: Self-pay

## 2023-04-26 ENCOUNTER — Ambulatory Visit (INDEPENDENT_AMBULATORY_CARE_PROVIDER_SITE_OTHER): Payer: 59 | Admitting: Licensed Clinical Social Worker

## 2023-04-26 DIAGNOSIS — F319 Bipolar disorder, unspecified: Secondary | ICD-10-CM | POA: Diagnosis not present

## 2023-04-27 ENCOUNTER — Encounter (HOSPITAL_COMMUNITY): Payer: Self-pay | Admitting: Licensed Clinical Social Worker

## 2023-04-27 NOTE — Progress Notes (Signed)
Virtual Visit via Video Note  I connected with Dawn Crosby on 04/26/23 at  3:30 PM EST by a video enabled telemedicine application and verified that I am speaking with the correct person using two identifiers.  Location: Patient: home Provider: home office   I discussed the limitations of evaluation and management by telemedicine and the availability of in person appointments. The patient expressed understanding and agreed to proceed.   I discussed the assessment and treatment plan with the patient. The patient was provided an opportunity to ask questions and all were answered. The patient agreed with the plan and demonstrated an understanding of the instructions.   The patient was advised to call back or seek an in-person evaluation if the symptoms worsen or if the condition fails to improve as anticipated.  I provided 45 minutes of non-face-to-face time during this encounter.   Dawn Melter, LCSW   THERAPIST PROGRESS NOTE  Session Time: 3:30pm-4:15pm  Participation Level: Active  Behavioral Response: CasualAlertEuthymic  Type of Therapy: Individual Therapy  Treatment Goals addressed:  "coping with stress, transitions, social anxiety". Dawn Crosby will improve her anxiety 5 out of 7 days as evidenced by reduction of sxs, ability to use coping skills, and increased confidence in herself and her decisions   ProgressTowards Goals: Progressing  Interventions: CBT  Summary: Dawn Crosby is a 22 y.o. female who presents with Bipolar I.   Suicidal/Homicidal: Nowithout intent/plan  Therapist Response: Dawn Crosby engaged well in individual virtual session with clinician.  Clinician utilized CBT to process thoughts feelings and behaviors.  Clinician explored updates with grief over loss of dog at last session as well as recent loss of a [place] pig.  Clinician reviewed grief process and noted that all of the changes happening in the house as well as changes at [name]'s job are  influencing her current mood.  Dawn Crosby shared updates about her social life, including relationship status and dating life.  Clinician assisted Dawn Crosby in problem solving and coping.  Dawn Crosby shared that her mood has been relatively stable, although she has made some decisions that in retrospect were not as safe as they should have been.  However she was able to identify her choices and identify better decisions for next time.  Plan: Return again in 3-4 weeks.  Diagnosis: Bipolar I disorder (HCC)  Collaboration of Care: Patient refused AEB none required  Patient/Guardian was advised Release of Information must be obtained prior to any record release in order to collaborate their care with an outside provider. Patient/Guardian was advised if they have not already done so to contact the registration department to sign all necessary forms in order for [place] to release information regarding their care.   Consent: Patient/Guardian gives verbal consent for treatment and assignment of benefits for services provided during this visit. Patient/Guardian expressed understanding and agreed to proceed.   Dawn Heck [place], LCSW 04/27/2023

## 2023-05-05 ENCOUNTER — Other Ambulatory Visit (HOSPITAL_COMMUNITY): Payer: Self-pay

## 2023-05-05 ENCOUNTER — Encounter: Payer: Self-pay | Admitting: Adult Health

## 2023-05-05 ENCOUNTER — Ambulatory Visit (INDEPENDENT_AMBULATORY_CARE_PROVIDER_SITE_OTHER): Payer: 59 | Admitting: Adult Health

## 2023-05-05 DIAGNOSIS — F900 Attention-deficit hyperactivity disorder, predominantly inattentive type: Secondary | ICD-10-CM | POA: Diagnosis not present

## 2023-05-05 DIAGNOSIS — F4010 Social phobia, unspecified: Secondary | ICD-10-CM | POA: Diagnosis not present

## 2023-05-05 DIAGNOSIS — F319 Bipolar disorder, unspecified: Secondary | ICD-10-CM | POA: Diagnosis not present

## 2023-05-05 MED ORDER — AMPHETAMINE-DEXTROAMPHETAMINE 20 MG PO TABS
20.0000 mg | ORAL_TABLET | Freq: Two times a day (BID) | ORAL | 0 refills | Status: DC
  Filled 2023-07-30: qty 60, 30d supply, fill #0

## 2023-05-05 MED ORDER — AMPHETAMINE-DEXTROAMPHETAMINE 20 MG PO TABS
20.0000 mg | ORAL_TABLET | Freq: Two times a day (BID) | ORAL | 0 refills | Status: DC
  Filled 2023-06-15: qty 60, 30d supply, fill #0

## 2023-05-05 MED ORDER — CLONAZEPAM 0.5 MG PO TABS
0.5000 mg | ORAL_TABLET | Freq: Every day | ORAL | 2 refills | Status: DC | PRN
  Filled 2023-05-05: qty 30, 30d supply, fill #0

## 2023-05-05 MED ORDER — AMPHETAMINE-DEXTROAMPHETAMINE 20 MG PO TABS
20.0000 mg | ORAL_TABLET | Freq: Two times a day (BID) | ORAL | 0 refills | Status: DC
  Filled 2023-05-05: qty 60, 30d supply, fill #0

## 2023-05-05 NOTE — Progress Notes (Signed)
Dawn Crosby [id number] 09-04-2000 22 y.o.  Subjective:   Patient ID:  Dawn Crosby is a 22 y.o. (DOB 2001/02/25) female.  Chief Complaint: No chief complaint on file.   HPI ROSEBELLA BOWER presents to the office today for follow-up of Bipolar disorder, ADHD, SAD, Social phobia.  Describes mood today as "ok". Pleasant. Tearful at times recent loss of dog and [place] pig. Mood symptoms - reports some depression, anxiety, and irritability. Reports one recent panic attack. Denies worry, rumination, and over thinking. Mood is stable given the circumstances. Stating "I feel like I'm doing ok, considering. Feels like current medications are helpful. Stable interest and motivation. Taking medications as prescribed.  Energy levels improved. Active, does not have a regular exercise routine. Walking.  Enjoys some usual interests and activities. Single. Living at home. Spending time with family. Appetite adequate. Weight gain - 250 pounds. Sleeps well most nights. Averages 6 to 8 hours. Focus and concentration stable. Completing tasks. Managing aspects of household. Working in [employer] ge[employer] - recent promotion.  Denies AH or VH. Denies self harm. Denies substance use. Seeing therapist.    [phone]    Flowsheet Row Office Visit from 04/26/2022 in [facility] Total Score 0      Flowsheet Row ED from 08/31/2020 in [facility] RISK CATEGORY No Risk        Review of Systems:  Review of Systems  Musculoskeletal:  Negative for gait problem.  Neurological:  Negative for tremors.  Psychiatric/Behavioral:         Please refer to HPI    Medications: I have reviewed the patient's current medications.  Current Outpatient Medications  Medication Sig Dispense Refill   amphetamine-dextroamphetamine (ADDERALL) 20 MG tablet Take 1 tablet (20 mg total) by mouth 2 (two) times daily. 60 tablet 0    amphetamine-dextroamphetamine (ADDERALL) 20 MG tablet Take 1 tablet (20 mg total) by mouth 2 (two) times daily. 60 tablet 0   amphetamine-dextroamphetamine (ADDERALL) 20 MG tablet Take 1 tablet (20 mg total) by mouth 2 (two) times daily. 60 tablet 0   amphetamine-dextroamphetamine (ADDERALL) 20 MG tablet Take 1 tablet (20 mg) by mouth 2 times daily for ADD 60 tablet 0   azithromycin (ZITHROMAX) 500 MG tablet Take 1 tablet (500 mg total) by mouth daily for 3 days 3 tablet 0   buPROPion (WELLBUTRIN XL) 300 MG 24 hr tablet TAKE 1 TABLET BY MOUTH ONCE DAILY 90 tablet 3   busPIRone (BUSPAR) 10 MG tablet Take 1 tablet (10 mg total) by mouth 2 (two) times daily. 180 tablet 3   cetirizine (ZYRTEC) 10 MG tablet Take 10 mg by mouth at bedtime.     clonazePAM (KLONOPIN) 0.5 MG tablet Take 1 tablet (0.5 mg total) by mouth daily as needed for anxiety or panic 30 tablet 2   co-enzyme Q-10 30 MG capsule Take 30 mg by mouth daily.     diphenhydrAMINE (BENADRYL) 25 mg capsule Take 1 each (25 mg total) by mouth every 6 (six) hours as needed for allergies for up to 5 days. 12 capsule 0   docusate sodium (COLACE) 100 MG capsule Take 100 mg by mouth at bedtime.     ibuprofen (ADVIL) 800 MG tablet Take 1 tablet by mouth every 8 hours with food 30 tablet 0   lamoTRIgine (LAMICTAL) 200 MG tablet Take 1 tablet (200 mg total) by mouth daily. 90 tablet 3   lamoTRIgine (LAMICTAL)  25 MG tablet Take 2 tablets (50 mg total) by mouth at bedtime. 180 tablet 1   melatonin 5 MG TABS Take 5 mg by mouth at bedtime.     Multiple Vitamin (MULTIVITAMIN) capsule Take 1 capsule by mouth daily.     Omega-3 1000 MG CAPS Take 1 capsule by mouth at bedtime.     QUEtiapine (SEROQUEL) 25 MG tablet Take 1 tablet (25 mg total) by mouth at bedtime. 90 tablet 3   No current facility-administered medications for this visit.    Medication Side Effects: None  Allergies:  Allergies  Allergen Reactions   Bactrim [Sulfamethoxazole-Trimethoprim]  Hives    Possible hives; pt was on bactrim and pcn, unsure which caused hives   Clindamycin/Lincomycin Hives   Kiwi Extract Other (See Comments)    Tongue tingling  Other reaction(s): tongue tingle   Penicillins Hives    Possible hives; pt was on bactrim and pcn, unsure which caused hives   Pineapple Other (See Comments)    Tongue allergy  Other reaction(s): tongue tingle   Prozac [Fluoxetine Hcl] Hives   Adhesive [Tape] Rash   Clindamycin Rash   Fluoxetine Rash    Other reaction(s): hives, itching    Past Medical History:  Diagnosis Date   ADHD    Anxiety    Asthma    as child    Bipolar disorder (HCC)    Depression     Past Medical History, Surgical history, Social history, and Family history were reviewed and updated as appropriate.   Please see review of systems for further details on the patient's review from today.   Objective:   Physical Exam:  There were no vitals taken for this visit.  Physical Exam Constitutional:      General: She is not in acute distress. Musculoskeletal:        General: No deformity.  Neurological:     Mental Status: She is alert and oriented to person, place, and time.     Coordination: Coordination normal.  Psychiatric:        Attention and Perception: Attention and perception normal. She does not perceive auditory or visual hallucinations.        Mood and Affect: Affect is not labile, blunt, angry or inappropriate.        Speech: Speech normal.        Behavior: Behavior normal.        Thought Content: Thought content normal. Thought content is not paranoid or delusional. Thought content does not include homicidal or suicidal ideation. Thought content does not include homicidal or suicidal plan.        Cognition and Memory: Cognition and memory normal.        Judgment: Judgment normal.     Comments: Insight intact     Lab Review:     Component Value Date/Time   NA 139 04/01/2021 1143   K 3.9 04/01/2021 1143   CL 104  04/01/2021 1143   CO2 28 04/01/2021 1143   GLUCOSE 87 04/01/2021 1143   BUN 12 04/01/2021 1143   CREATININE 0.91 04/01/2021 1143   CREATININE 0.69 12/31/2015 0001   CALCIUM 9.6 04/01/2021 1143   PROT 7.1 04/01/2021 1143   ALBUMIN 4.4 04/01/2021 1143   AST 14 04/01/2021 1143   ALT 13 04/01/2021 1143   ALKPHOS 70 04/01/2021 1143   BILITOT 0.3 04/01/2021 1143       Component Value Date/Time   WBC 7.8 04/01/2021 1143   RBC 4.41 04/01/2021 1143  HGB 13.0 04/01/2021 1143   HCT 38.3 04/01/2021 1143   PLT 335.0 04/01/2021 1143   MCV 86.9 04/01/2021 1143   MCHC 34.1 04/01/2021 1143   RDW 12.5 04/01/2021 1143    No results found for: "POCLITH", "LITHIUM"   No results found for: "PHENYTOIN", "PHENOBARB", "VALPROATE", "CBMZ"   .res Assessment: Plan:    Plan:  PDMP reviewed  Adderall 20mg  BID Buspar 10mg  BID Lamictal 200mg  daily Lamictal 50mg  daily Wellbutrin XL 300mg  in the am  Seroquel 100mg  - 1/2 to one tablet as needed Clonazepam 0.5mg  as needed for anxiety  BP 93/61/97  3 months  Discussed potential benefits, risk, and side effects of benzodiazepines to include potential risk of tolerance and dependence, as well as possible drowsiness.  Advised patient not to drive if experiencing drowsiness and to take lowest possible effective dose to minimize risk of dependence and tolerance.   Discussed potential benefits, risks, and side effects of stimulants with patient to include increased heart rate, palpitations, insomnia, increased anxiety, increased irritability, or decreased appetite.  Instructed patient to contact office if experiencing any significant tolerability issues.   Discussed potential metabolic side effects associated with atypical antipsychotics, as well as potential risk for movement side effects. Advised pt to contact office if movement side effects occur.    Counseled patient regarding potential benefits, risks, and side effects of Lamictal to include  potential risk of Stevens-Johnson syndrome. Advised patient to stop taking Lamictal and contact office immediately if rash develops and to seek urgent medical attention if rash is severe and/or spreading quickly.  There are no diagnoses linked to this encounter.   Please see After Visit Summary for patient specific instructions.  Future Appointments  Date Time Provider Department Center  05/18/2023  4:30 PM Maia Plan, Chryl Heck, LCSW [facility] None    No orders of the defined types were placed in this encounter.   -------------------------------

## 2023-05-10 ENCOUNTER — Other Ambulatory Visit (HOSPITAL_COMMUNITY): Payer: Self-pay

## 2023-05-11 ENCOUNTER — Other Ambulatory Visit (HOSPITAL_COMMUNITY): Payer: Self-pay

## 2023-05-18 ENCOUNTER — Ambulatory Visit (HOSPITAL_COMMUNITY): Payer: 59 | Admitting: Licensed Clinical Social Worker

## 2023-05-18 DIAGNOSIS — F319 Bipolar disorder, unspecified: Secondary | ICD-10-CM

## 2023-05-19 ENCOUNTER — Encounter (HOSPITAL_COMMUNITY): Payer: Self-pay | Admitting: Licensed Clinical Social Worker

## 2023-05-19 NOTE — Progress Notes (Signed)
 Virtual Visit via Video Note  I connected with Dawn Crosby on 05/18/23 at  4:30 PM EST by a video enabled telemedicine application and verified that I am speaking with the correct person using two identifiers.  Location: Patient: home Provider: home office   I discussed the limitations of evaluation and management by telemedicine and the availability of in person appointments. The patient expressed understanding and agreed to proceed.   I discussed the assessment and treatment plan with the patient. The patient was provided an opportunity to ask questions and all were answered. The patient agreed with the plan and demonstrated an understanding of the instructions.   The patient was advised to call back or seek an in-person evaluation if the symptoms worsen or if the condition fails to improve as anticipated.  I provided 55 minutes of non-face-to-face time during this encounter.   Dawn JONELLE Rosser, LCSW   THERAPIST PROGRESS NOTE  Session Time: 4:35pm-5:30pm  Participation Level: Active  Behavioral Response: Well GroomedAlertDepressed and Irritable  Type of Therapy: Individual Therapy  Treatment Goals addressed: coping with stress, transitions, social anxiety. Dawn Crosby will improve her anxiety 5 out of 7 days as evidenced by reduction of sxs, ability to use coping skills, and increased confidence in herself and her decisions   ProgressTowards Goals: Progressing  Interventions: CBT  Summary: Dawn Crosby is a 23 y.o. female who presents with Bipolar I disorder, depressed.   Suicidal/Homicidal: Nowithout intent/plan  Therapist Response: Dawn Crosby engaged well in armed forces logistics/support/administrative officer with facilities manager.  Clinician utilized CBT to process thoughts feelings and interactions.  Clinician explored holidays and relationships with family, friends, and new partner.  Clinician discussed interactions with new partner and mood/patience with partner.  Dawn Crosby shared that she has been more  irritable lately, possibly because of the bipolar disorder, but also due to being sick.  Clinician discussed ways to communicate with new partner about needs and wants.  Clinician also identified coping strategies that will help her increase her patience levels and increase kind words and behaviors towards new partner.  Dawn Crosby shared that she likes this new person and he has been accepted into her family already, even though they have only been talking for the past month.  Clinician explored Dawn Crosby's need to slow down the relationship as it is moving quite quickly.  Plan: Return again in 3 weeks.  Diagnosis: Bipolar I disorder (HCC)  Collaboration of Care: Patient refused AEB none required  Patient/Guardian was advised Release of Information must be obtained prior to any record release in order to collaborate their care with an outside provider. Patient/Guardian was advised if they have not already done so to contact the registration department to sign all necessary forms in order for us  to release information regarding their care.   Consent: Patient/Guardian gives verbal consent for treatment and assignment of benefits for services provided during this visit. Patient/Guardian expressed understanding and agreed to proceed.   Dawn JONELLE [place], LCSW 05/19/2023

## 2023-05-24 DIAGNOSIS — E282 Polycystic ovarian syndrome: Secondary | ICD-10-CM | POA: Diagnosis not present

## 2023-05-24 DIAGNOSIS — Z30431 Encounter for routine checking of intrauterine contraceptive device: Secondary | ICD-10-CM | POA: Diagnosis not present

## 2023-06-08 ENCOUNTER — Ambulatory Visit (HOSPITAL_COMMUNITY): Payer: 59 | Admitting: Licensed Clinical Social Worker

## 2023-06-08 ENCOUNTER — Encounter (HOSPITAL_COMMUNITY): Payer: Self-pay | Admitting: Licensed Clinical Social Worker

## 2023-06-08 DIAGNOSIS — F319 Bipolar disorder, unspecified: Secondary | ICD-10-CM

## 2023-06-08 NOTE — Progress Notes (Signed)
Virtual Visit via Video Note  I connected with Dawn Crosby on 06/08/23 at 12:30 PM EST by a video enabled telemedicine application and verified that I am speaking with the correct person using two identifiers.  Location: Patient: home Provider: home office   I discussed the limitations of evaluation and management by telemedicine and the availability of in person appointments. The patient expressed understanding and agreed to proceed.   I discussed the assessment and treatment plan with the patient. The patient was provided an opportunity to ask questions and all were answered. The patient agreed with the plan and demonstrated an understanding of the instructions.   The patient was advised to call back or seek an in-person evaluation if the symptoms worsen or if the condition fails to improve as anticipated.  I provided 55 minutes of non-face-to-face time during this encounter.   [name], LCSW   THERAPIST PROGRESS NOTE  Session Time: 12:30pm-1:25pm  Participation Level: Active  Behavioral Response: Well GroomedAlertEuthymic and Irritable  Type of Therapy: Individual Therapy  Treatment Goals addressed: "coping with stress, transitions, social anxiety". Dawn Crosby will improve her anxiety 5 out of 7 days as evidenced by reduction of sxs, ability to use coping skills, and increased confidence in herself and her decisions     ProgressTowards Goals: Progressing  Interventions: CBT  Summary: Dawn Crosby is a 23 y.o. female who presents with Bipolar I.   Suicidal/Homicidal: Nowithout intent/plan  Therapist Response: Dawn Crosby engaged well in individual virtual session with clinician.  Clinician utilized CBT to process thoughts feelings and interactions.  Clinician explored updates on house projects, including remodeling her bedroom.  Clinician processed challenges with organization, change, and adjustment to this space.  Clinician explored grief following the losses of  several pets over the past few months.  Clinician identified the importance of asking for help, as well as the importance of acknowledging the importance of her role in caring for her other pets.  Dawn Crosby shared updates with new boyfriend.  Clinician discussed the importance of being present in the moment with this boyfriend, rather than planning ahead for marriage or a long-term commitment.  Clinician identified that she and boyfriend have only known each other for a few months and that she has plenty of time to get to know him and to make decisions.  Clinician discussed bit the big changes that have occurred over the past year, things that she wants to bring with her, and things that no longer serve her.  Clinician worked to [employer] and her ability to choose what happens in her life.  Plan: Return again in 3 weeks.  Diagnosis: Bipolar I disorder (HCC)  Collaboration of Care: Patient refused AEB none required  Patient/Guardian was advised Release of Information must be obtained prior to any record release in order to collaborate their care with an outside provider. Patient/Guardian was advised if they have not already done so to contact the registration department to sign all necessary forms in order for [place] to release information regarding their care.   Consent: Patient/Guardian gives verbal consent for treatment and assignment of benefits for services provided during this visit. Patient/Guardian expressed understanding and agreed to proceed.   Chryl Heck [place], LCSW 06/08/2023

## 2023-06-15 ENCOUNTER — Other Ambulatory Visit: Payer: Self-pay

## 2023-06-15 ENCOUNTER — Other Ambulatory Visit (HOSPITAL_COMMUNITY): Payer: Self-pay

## 2023-06-22 ENCOUNTER — Ambulatory Visit (INDEPENDENT_AMBULATORY_CARE_PROVIDER_SITE_OTHER): Payer: 59 | Admitting: Licensed Clinical Social Worker

## 2023-06-22 DIAGNOSIS — F4321 Adjustment disorder with depressed mood: Secondary | ICD-10-CM

## 2023-06-22 DIAGNOSIS — F319 Bipolar disorder, unspecified: Secondary | ICD-10-CM

## 2023-06-26 ENCOUNTER — Encounter (HOSPITAL_COMMUNITY): Payer: Self-pay | Admitting: Licensed Clinical Social Worker

## 2023-06-26 NOTE — Progress Notes (Signed)
 Virtual Visit via Video Note  I connected with Dawn Crosby on  at 06/22/23 1:30 PM EST by a video enabled telemedicine application and verified that I am speaking with the correct person using two identifiers.  Location: Patient: home Provider: home office   I discussed the limitations of evaluation and management by telemedicine and the availability of in person appointments. The patient expressed understanding and agreed to proceed.   I discussed the assessment and treatment plan with the patient. The patient was provided an opportunity to ask questions and all were answered. The patient agreed with the plan and demonstrated an understanding of the instructions.   The patient was advised to call back or seek an in-person evaluation if the symptoms worsen or if the condition fails to improve as anticipated.  I provided 45 minutes of non-face-to-face time during this encounter.   Dawn JONELLE Rosser, LCSW   THERAPIST PROGRESS NOTE  Session Time: 1:30pm-2:15pm  Participation Level: Active  Behavioral Response: Well GroomedAlertDepressed  Type of Therapy: Individual Therapy  Treatment Goals addressed: coping with stress, transitions, social anxiety. Dawn Crosby will improve her anxiety 5 out of 7 days as evidenced by reduction of sxs, ability to use coping skills, and increased confidence in herself and her decisions   ProgressTowards Goals: Progressing  Interventions: CBT  Summary: Dawn Crosby is a 23 y.o. female who presents with Bipolar I, depressed, grief.   Suicidal/Homicidal: Nowithout intent/plan  Therapist Response: Dawn Crosby engaged well in armed forces logistics/support/administrative officer with facilities manager.  Clinician utilized CBT to process thoughts feelings and interactions.  Clinician provided time for Dawn Crosby to share updates about friends and family members.  Dawn Crosby shared that her ex-boyfriend's little sister died of the flu a few weeks ago.  She reported that this hit her much harder than  she expected it to, due to its unexpected nature.  Dawn Crosby provided feedback about how it felt to go to the funeral, see her ex-boyfriend, and reunite with her ex's mother.  Clinician reflected sadness and also positivity after that visit, noting that she was able to get closure and reestablish a relationship with ex-boyfriend's mother.  Clinician explored current status of mood, and relationship with new boyfriend.  Dawn Crosby identified that things overall were going okay.  Mood has been relatively stable, although grief has increased sadness and some irritability.  Plan: Return again in 2 weeks.  Diagnosis: Bipolar I disorder (HCC)  Grief  Collaboration of Care: Patient refused AEB none required  Patient/Guardian was advised Release of Information must be obtained prior to any record release in order to collaborate their care with an outside provider. Patient/Guardian was advised if they have not already done so to contact the registration department to sign all necessary forms in order for us  to release information regarding their care.   Consent: Patient/Guardian gives verbal consent for treatment and assignment of benefits for services provided during this visit. Patient/Guardian expressed understanding and agreed to proceed.   Dawn JONELLE [place], LCSW 06/26/2023

## 2023-06-29 ENCOUNTER — Ambulatory Visit (INDEPENDENT_AMBULATORY_CARE_PROVIDER_SITE_OTHER): Payer: 59 | Admitting: Licensed Clinical Social Worker

## 2023-06-29 DIAGNOSIS — F319 Bipolar disorder, unspecified: Secondary | ICD-10-CM

## 2023-06-30 ENCOUNTER — Encounter (HOSPITAL_COMMUNITY): Payer: Self-pay | Admitting: Licensed Clinical Social Worker

## 2023-06-30 NOTE — Progress Notes (Signed)
No-show.  Dawn Crosby contacted clinician 10 minutes prior to appointment that she was unable to attend.

## 2023-07-06 ENCOUNTER — Ambulatory Visit (HOSPITAL_COMMUNITY): Payer: 59 | Admitting: Licensed Clinical Social Worker

## 2023-07-13 ENCOUNTER — Ambulatory Visit (HOSPITAL_COMMUNITY): Payer: 59 | Admitting: Licensed Clinical Social Worker

## 2023-07-26 ENCOUNTER — Ambulatory Visit (INDEPENDENT_AMBULATORY_CARE_PROVIDER_SITE_OTHER): Payer: 59 | Admitting: Licensed Clinical Social Worker

## 2023-07-26 DIAGNOSIS — F319 Bipolar disorder, unspecified: Secondary | ICD-10-CM

## 2023-07-31 ENCOUNTER — Other Ambulatory Visit: Payer: Self-pay

## 2023-07-31 ENCOUNTER — Other Ambulatory Visit (HOSPITAL_COMMUNITY): Payer: Self-pay

## 2023-08-03 ENCOUNTER — Ambulatory Visit (INDEPENDENT_AMBULATORY_CARE_PROVIDER_SITE_OTHER): Payer: 59 | Admitting: Adult Health

## 2023-08-03 DIAGNOSIS — Z0389 Encounter for observation for other suspected diseases and conditions ruled out: Secondary | ICD-10-CM

## 2023-08-03 NOTE — Progress Notes (Signed)
 Patient no show appointment. ? ?

## 2023-08-05 ENCOUNTER — Encounter (HOSPITAL_COMMUNITY): Payer: Self-pay | Admitting: Licensed Clinical Social Worker

## 2023-08-05 NOTE — Progress Notes (Signed)
 Virtual Visit via Video Note  I connected with Dawn Crosby on 07/26/23 at  4:30 PM EDT by a video enabled telemedicine application and verified that I am speaking with the correct person using two identifiers.  Location: Patient: Home Provider: Home office   I discussed the limitations of evaluation and management by telemedicine and the availability of in person appointments. The patient expressed understanding and agreed to proceed.      I discussed the assessment and treatment plan with the patient. The patient was provided an opportunity to ask questions and all were answered. The patient agreed with the plan and demonstrated an understanding of the instructions.   The patient was advised to call back or seek an in-person evaluation if the symptoms worsen or if the condition fails to improve as anticipated.  I provided 45 minutes of non-face-to-face time during this encounter.   [name], LCSW   THERAPIST PROGRESS NOTE  Session Time: 4:30pm-5:15pm  Participation Level: Active  Behavioral Response: NeatAlertEuthymic  Type of Therapy: Individual Therapy  Treatment Goals addressed: "coping with stress, transitions, social anxiety". Dawn Crosby will improve her anxiety 5 out of 7 days as evidenced by reduction of sxs, ability to use coping skills, and increased confidence in herself and her decisions     ProgressTowards Goals: Progressing  Interventions: CBT  Summary: Dawn Crosby is a 23 y.o. female who presents with Bipolar I disorder.   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  Clinician utilized CBT to process thoughts feelings and interactions.  Clinician discussed progress in painting bedroom, at work, and in relationships.  Clinician identified the challenges in executive functioning in order to start tasks.  Clinician discussed coping skills and "tips and tricks" for  managing her ADHD brain.  Clinician processed moods and recent challenges with family.  Dawn Crosby shared she has been more capable of slowing herself down when she gets upset, in order to avoid arguments.  Plan: Return again in 2-3 weeks.  Diagnosis: Bipolar I disorder (HCC)  Collaboration of Care: Patient refused AEB none required  Patient/Guardian was advised Release of Information must be obtained prior to any record release in order to collaborate their care with an outside provider. Patient/Guardian was advised if they have not already done so to contact the registration department to sign all necessary forms in order for [place] to release information regarding their care.   Consent: Patient/Guardian gives verbal consent for treatment and assignment of benefits for services provided during this visit. Patient/Guardian expressed understanding and agreed to proceed.   Chryl Heck [place], LCSW 08/05/2023

## 2023-08-16 ENCOUNTER — Ambulatory Visit (HOSPITAL_COMMUNITY): Admitting: Licensed Clinical Social Worker

## 2023-09-06 ENCOUNTER — Other Ambulatory Visit (HOSPITAL_COMMUNITY): Payer: Self-pay

## 2023-09-06 ENCOUNTER — Other Ambulatory Visit: Payer: Self-pay

## 2023-09-14 ENCOUNTER — Other Ambulatory Visit (HOSPITAL_COMMUNITY): Payer: Self-pay

## 2023-09-14 ENCOUNTER — Ambulatory Visit (INDEPENDENT_AMBULATORY_CARE_PROVIDER_SITE_OTHER): Admitting: Licensed Clinical Social Worker

## 2023-09-14 DIAGNOSIS — F319 Bipolar disorder, unspecified: Secondary | ICD-10-CM

## 2023-09-20 ENCOUNTER — Encounter (HOSPITAL_COMMUNITY): Payer: Self-pay | Admitting: Licensed Clinical Social Worker

## 2023-09-20 NOTE — Progress Notes (Signed)
 Virtual Visit via Video Note  I connected with Dawn Crosby on 09/14/23 at  1:30 PM EDT by a video enabled telemedicine application and verified that I am speaking with the correct person using two identifiers.  Location: Patient: home Provider: home office   I discussed the limitations of evaluation and management by telemedicine and the availability of in person appointments. The patient expressed understanding and agreed to proceed.   I discussed the assessment and treatment plan with the patient. The patient was provided an opportunity to ask questions and all were answered. The patient agreed with the plan and demonstrated an understanding of the instructions.   The patient was advised to call back or seek an in-person evaluation if the symptoms worsen or if the condition fails to improve as anticipated.  I provided 45 minutes of non-face-to-face time during this encounter.   Dawn Dago, LCSW   THERAPIST PROGRESS NOTE  Session Time: 1:30-2:15pm  Participation Level: Active  Behavioral Response: NeatAlertEuthymic  Type of Therapy: Individual Therapy  Treatment Goals addressed: "coping with stress, transitions, social anxiety". Dawn Crosby will improve her anxiety 5 out of 7 days as evidenced by reduction of sxs, ability to use coping skills, and increased confidence in herself and her decisions   ProgressTowards Goals: Progressing  Interventions: CBT  Summary: Dawn Crosby is a 23 y.o. female who presents with Bipolar I disorder.   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 Clinician utilized CBT to process thoughts, feelings, and interactions. Clinician processed recent challenges and concerns about job. Clinician discussed interactions with coworkers which have been frustrating and challenging. Clinician explored Dawn Crosby's decision making process and incorporated the challenging  of thoughts when reactions are expected. Clinician reviewed Catch Challenge Change. Clinician discussed plans to transition to a new job, new company, new store. Clinician noted the positivity felt by Dawn Crosby and encouraged Dawn Crosby to get prepared to focus, learn, and challenge herself to succeed.   Plan: Return again in 2-4 weeks.  Diagnosis: Bipolar I disorder (HCC)  Collaboration of Care: Patient refused AEB none required  Patient/Guardian was advised Release of Information must be obtained prior to any record release in order to collaborate their care with an outside provider. Patient/Guardian was advised if they have not already done so to contact the registration department to sign all necessary forms in order for us  to release information regarding their care.   Consent: Patient/Guardian gives verbal consent for treatment and assignment of benefits for services provided during this visit. Patient/Guardian expressed understanding and agreed to proceed.   Dawn Stare [place], LCSW 09/20/2023

## 2023-10-19 ENCOUNTER — Ambulatory Visit (HOSPITAL_COMMUNITY): Admitting: Licensed Clinical Social Worker

## 2023-10-19 ENCOUNTER — Encounter (HOSPITAL_COMMUNITY): Payer: Self-pay | Admitting: Licensed Clinical Social Worker

## 2023-10-19 DIAGNOSIS — F319 Bipolar disorder, unspecified: Secondary | ICD-10-CM

## 2023-10-19 NOTE — Progress Notes (Signed)
 Virtual Visit via Video Note  I connected with Dawn Crosby on 10/19/23 at  1:30 PM EDT by a video enabled telemedicine application and verified that I am speaking with the correct person using two identifiers.  Location: Patient: home Provider: home office   I discussed the limitations of evaluation and management by telemedicine and the availability of in person appointments. The patient expressed understanding and agreed to proceed.   I discussed the assessment and treatment plan with the patient. The patient was provided an opportunity to ask questions and all were answered. The patient agreed with the plan and demonstrated an understanding of the instructions.   The patient was advised to call back or seek an in-person evaluation if the symptoms worsen or if the condition fails to improve as anticipated.  I provided 55 minutes of non-face-to-face time during this encounter.   Seldon Dago, LCSW   THERAPIST PROGRESS NOTE  Session Time: 1:30pm-2:25pm  Participation Level: Active  Behavioral Response: NeatAlertDepressed and Irritable  Type of Therapy: Individual Therapy  Treatment Goals addressed:  "coping with stress, transitions, social anxiety". [name] improve her anxiety 5 out of 7 days as evidenced by reduction of sxs, ability to use coping skills, and increased confidence in herself and her decisions   ProgressTowards Goals: Progressing  Interventions: CBT  Summary: [name] is a 23 y.o. female who presents with Bipolar I disorder.   Suicidal/Homicidal: Nowithout intent/plan  Therapist Response: [name] engaged well in Armed forces logistics/support/administrative officer with Facilities manager. Clinician utilized CBT to explore thoughts, feelings, and behaviors. Clinician explored current triggers at work which have made for some challenging interactions lately. Clinician processed [name]'s thoughts and feelings, as well as how she coped with these issues. Clinician reflected the  value of speaking clearly and directly about her concerns. Clinician provided CBT reality testing about her options. Clinician explored thoughts about returning to school. Mood is stable. Going through some grief following recent loss of grandmother.   Plan: Return again in 4 weeks.  Diagnosis: Bipolar I disorder (HCC)  Collaboration of Care: Patient refused AEB none required  Patient/Guardian was advised Release of Information must be obtained prior to any record release in order to collaborate their care with an outside provider. Patient/Guardian was advised if they have not already done so to contact the registration department to sign all necessary forms in order for us  to release information regarding their care.   Consent: Patient/Guardian gives verbal consent for treatment and assignment of benefits for services provided during this visit. Patient/Guardian expressed understanding and agreed to proceed.   [name] [place], LCSW 10/19/2023

## 2023-11-15 ENCOUNTER — Ambulatory Visit (INDEPENDENT_AMBULATORY_CARE_PROVIDER_SITE_OTHER): Admitting: Licensed Clinical Social Worker

## 2023-11-15 ENCOUNTER — Encounter (HOSPITAL_COMMUNITY): Payer: Self-pay | Admitting: Licensed Clinical Social Worker

## 2023-11-15 DIAGNOSIS — F319 Bipolar disorder, unspecified: Secondary | ICD-10-CM

## 2023-11-15 NOTE — Progress Notes (Signed)
 Virtual Visit via Video Note  I connected with Dawn Crosby on 11/15/23 at 10:00 AM EDT by a video enabled telemedicine application and verified that I am speaking with the correct person using two identifiers.  Location: Patient: Home Provider: Home office   I discussed the limitations of evaluation and management by telemedicine and the availability of in person appointments. The patient expressed understanding and agreed to proceed.    I discussed the assessment and treatment plan with the patient. The patient was provided an opportunity to ask questions and all were answered. The patient agreed with the plan and demonstrated an understanding of the instructions.   The patient was advised to call back or seek an in-person evaluation if the symptoms worsen or if the condition fails to improve as anticipated.  I provided 45 minutes of non-face-to-face time during this encounter.   Harlene JONELLE Rosser, LCSW   THERAPIST PROGRESS NOTE  Session Time: 10:00am-10:45am  Participation Level: Active  Behavioral Response: NeatAlertIrritable  Type of Therapy: Individual Therapy  Treatment Goals addressed:  coping with stress, transitions, social anxiety. Dawn Crosby will improve her anxiety 5 out of 7 days as evidenced by reduction of sxs, ability to use coping skills, and increased confidence in herself and her decisions   ProgressTowards Goals: Progressing  Interventions: Motivational Interviewing  Summary: Dawn Crosby is a 23 y.o. female who presents with Bipolar I.   Suicidal/Homicidal: Nowithout intent/plan  Therapist Response: Dawn Crosby engaged well in individual virtual session with clinician.  Clinician utilized motivational interviewing to process thoughts feelings and interactions.  Clinician explored updates with job and social interactions with coworkers.  Clinician processed recent stressful interactions with a female coworker.  Clinician discussed reporting and human  resources responses.  Clinician explored options for the next level of support, since HR was not helpful.  Clinician identified increase in Dawn Crosby's willingness to confront this coworker, and stand up for herself.  Clinician reflected improvement in self-esteem and assertiveness.  Clinician explored relationship with boyfriend and noted that she is happy and comfortable in the relationship.  Plan: Return again in 4 weeks.  Diagnosis: Bipolar I disorder (HCC)  Collaboration of Care: Patient refused AEB none required  Patient/Guardian was advised Release of Information must be obtained prior to any record release in order to collaborate their care with an outside provider. Patient/Guardian was advised if they have not already done so to contact the registration department to sign all necessary forms in order for us  to release information regarding their care.   Consent: Patient/Guardian gives verbal consent for treatment and assignment of benefits for services provided during this visit. Patient/Guardian expressed understanding and agreed to proceed.   Harlene JONELLE [place], LCSW 11/15/2023

## 2023-12-04 ENCOUNTER — Other Ambulatory Visit (HOSPITAL_COMMUNITY): Payer: Self-pay

## 2023-12-20 ENCOUNTER — Encounter (HOSPITAL_COMMUNITY): Payer: Self-pay | Admitting: Licensed Clinical Social Worker

## 2023-12-20 ENCOUNTER — Ambulatory Visit (HOSPITAL_COMMUNITY): Admitting: Licensed Clinical Social Worker

## 2023-12-20 DIAGNOSIS — F319 Bipolar disorder, unspecified: Secondary | ICD-10-CM

## 2023-12-20 NOTE — Progress Notes (Signed)
 Virtual Visit via Video Note  I connected with Dawn Crosby on 12/20/23 at 10:00 AM EDT by a video enabled telemedicine application and verified that I am speaking with the correct person using two identifiers.  Location: Patient: home Provider: home office   I discussed the limitations of evaluation and management by telemedicine and the availability of in person appointments. The patient expressed understanding and agreed to proceed.   I discussed the assessment and treatment plan with the patient. The patient was provided an opportunity to ask questions and all were answered. The patient agreed with the plan and demonstrated an understanding of the instructions.   The patient was advised to call back or seek an in-person evaluation if the symptoms worsen or if the condition fails to improve as anticipated.  I provided 55 minutes of non-face-to-face time during this encounter.   Harlene JONELLE Rosser, LCSW   THERAPIST PROGRESS NOTE  Session Time: 10:00am-10:55am  Participation Level: Active  Behavioral Response: NeatAlertAnxious and Euthymic  Type of Therapy: Individual Therapy  Treatment Goals addressed: coping with stress, transitions, social anxiety. Dawn Crosby will improve her anxiety 5 out of 7 days as evidenced by reduction of sxs, ability to use coping skills, and increased confidence in herself and her decisions   ProgressTowards Goals: Progressing  Interventions: Motivational Interviewing  Summary: Dawn Crosby is a 23 y.o. female who presents with Bipolar I, depressed.   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 Clinician utilized MI OARS to reflect and summarize thoughts, feelings, and interactions. 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 Clinician provided time and space for Dawn Crosby to share the story and examined Dawn Crosby's decision  making process. Clinician reflected anxiety and discomfort when being yelled at. Clinician identified the importance of feeling safe at work, particularly with management. Dawn Crosby shared updates about other opportunities and was able to see the pros and cons of taking the new position. Clinician noted improvement in problem-solving, willingness to take responsibility, and connecting with her strengths.   Plan: Return again in 4 weeks.  Diagnosis: Bipolar I disorder (HCC)  Collaboration of Care: Patient refused AEB none required  Patient/Guardian was advised Release of Information must be obtained prior to any record release in order to collaborate their care with an outside provider. Patient/Guardian was advised if they have not already done so to contact the registration department to sign all necessary forms in order for us  to release information regarding their care.   Consent: Patient/Guardian gives verbal consent for treatment and assignment of benefits for services provided during this visit. Patient/Guardian expressed understanding and agreed to proceed.   Harlene JONELLE [place], LCSW 12/20/2023

## 2024-01-17 ENCOUNTER — Ambulatory Visit (HOSPITAL_COMMUNITY): Admitting: Licensed Clinical Social Worker

## 2024-01-25 ENCOUNTER — Ambulatory Visit (HOSPITAL_COMMUNITY): Admitting: Licensed Clinical Social Worker

## 2024-01-25 ENCOUNTER — Encounter (HOSPITAL_COMMUNITY): Payer: Self-pay | Admitting: Licensed Clinical Social Worker

## 2024-01-25 DIAGNOSIS — F319 Bipolar disorder, unspecified: Secondary | ICD-10-CM

## 2024-01-25 NOTE — Progress Notes (Signed)
 Virtual Visit via Video Note  I connected with Dawn Crosby on 01/25/24 at  8:00 AM EDT by a video enabled telemedicine application and verified that I am speaking with the correct person using two identifiers.  Location: Patient: home Provider: home office   I discussed the limitations of evaluation and management by telemedicine and the availability of in person appointments. The patient expressed understanding and agreed to proceed.   I discussed the assessment and treatment plan with the patient. The patient was provided an opportunity to ask questions and all were answered. The patient agreed with the plan and demonstrated an understanding of the instructions.   The patient was advised to call back or seek an in-person evaluation if the symptoms worsen or if the condition fails to improve as anticipated.  I provided 55 minutes of non-face-to-face time during this encounter.   Harlene JONELLE Rosser, LCSW   THERAPIST PROGRESS NOTE  Session Time: 8:00am-8:55am  Participation Level: Active  Behavioral Response: Well GroomedAlertEuthymic  Type of Therapy: Individual Therapy  Treatment Goals addressed: coping with stress, transitions, social anxiety. Dawn Crosby will improve her anxiety 5 out of 7 days as evidenced by reduction of sxs, ability to use coping skills, and increased confidence in herself and her decisions     ProgressTowards Goals: Progressing  Interventions: CBT  Summary: Dawn Crosby is a 23 y.o. female who presents with Bipolar I.   Suicidal/Homicidal: Nowithout intent/plan  Therapist Response: Dawn Crosby engaged well in individual virtual session with clinician. Clinician utilized CBT to process thoughts, feelings, and interactions. Clinician discussed interactions at work and noted some improvement due to a change in leadership. Clinician processed the problems with the previous team lead and noted improvement in Dawn Crosby's attitude and comfort at work now that  this person is no longer working there. Clinician discussed communication and interactions with family and boyfriend. Clinician also noted the improvement in mood stability with medication onboard. Dawn Crosby is showing improvement in her own awareness of her mood and her feelings.  Plan: Return again in 4 weeks.  Diagnosis: Bipolar I disorder (HCC)  Collaboration of Care: Patient refused AEB none required  Patient/Guardian was advised Release of Information must be obtained prior to any record release in order to collaborate their care with an outside provider. Patient/Guardian was advised if they have not already done so to contact the registration department to sign all necessary forms in order for us  to release information regarding their care.   Consent: Patient/Guardian gives verbal consent for treatment and assignment of benefits for services provided during this visit. Patient/Guardian expressed understanding and agreed to proceed.   Harlene JONELLE [place], LCSW 01/25/2024

## 2024-02-14 ENCOUNTER — Ambulatory Visit (INDEPENDENT_AMBULATORY_CARE_PROVIDER_SITE_OTHER): Admitting: Licensed Clinical Social Worker

## 2024-02-14 DIAGNOSIS — F319 Bipolar disorder, unspecified: Secondary | ICD-10-CM

## 2024-02-15 ENCOUNTER — Encounter (HOSPITAL_COMMUNITY): Payer: Self-pay | Admitting: Licensed Clinical Social Worker

## 2024-02-15 NOTE — Progress Notes (Signed)
 Virtual Visit via Video Note  I connected with Dawn Crosby on 02/14/24 at 11:00 AM EDT by a video enabled telemedicine application and verified that I am speaking with the correct person using two identifiers.  Location: Patient: home Provider: home office   I discussed the limitations of evaluation and management by telemedicine and the availability of in person appointments. The patient expressed understanding and agreed to proceed.   I discussed the assessment and treatment plan with the patient. The patient was provided an opportunity to ask questions and all were answered. The patient agreed with the plan and demonstrated an understanding of the instructions.   The patient was advised to call back or seek an in-person evaluation if the symptoms worsen or if the condition fails to improve as anticipated.  I provided 45 minutes of non-face-to-face time during this encounter.   Harlene JONELLE Rosser, LCSW   THERAPIST PROGRESS NOTE  Session Time: 11:00am-11:45am  Participation Level: Active  Behavioral Response: NeatAlertIrritable  Type of Therapy: Individual Therapy  Treatment Goals addressed: coping with stress, transitions, social anxiety. Dawn Crosby will improve her anxiety 5 out of 7 days as evidenced by reduction of sxs, ability to use coping skills, and increased confidence in herself and her decisions   ProgressTowards Goals: Progressing  Interventions: Motivational Interviewing  Summary: Dawn Crosby is a 23 y.o. female who presents with Bipolar I disorder.   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 Clinician utilized MI OARS to reflect and summarize thoughts, feelings, and interactions. Clinician processed relationships at home and work. Clinician also noted that there have been rare occurrences of outbursts or problems. Clinician explored interactions with other co-workers.  Clinician identified communication skills that could be useful. Clinician processed relationships at home, as well as coping skills for stress management. Clinician noted that overall Dawn Crosby is doing well and mood has been stable.   Plan: Return again in 2 weeks.  Diagnosis: Bipolar I disorder (HCC)  Collaboration of Care: Patient refused AEB none required  Patient/Guardian was advised Release of Information must be obtained prior to any record release in order to collaborate their care with an outside provider. Patient/Guardian was advised if they have not already done so to contact the registration department to sign all necessary forms in order for us  to release information regarding their care.   Consent: Patient/Guardian gives verbal consent for treatment and assignment of benefits for services provided during this visit. Patient/Guardian expressed understanding and agreed to proceed.   Harlene JONELLE [place], LCSW 02/15/2024

## 2024-03-07 ENCOUNTER — Ambulatory Visit (HOSPITAL_COMMUNITY): Admitting: Licensed Clinical Social Worker

## 2024-03-07 ENCOUNTER — Encounter (HOSPITAL_COMMUNITY): Payer: Self-pay

## 2024-03-19 ENCOUNTER — Other Ambulatory Visit: Payer: Self-pay | Admitting: Adult Health

## 2024-03-19 DIAGNOSIS — F319 Bipolar disorder, unspecified: Secondary | ICD-10-CM

## 2024-03-20 NOTE — Telephone Encounter (Signed)
 Sent MyChart message asking to schedule.

## 2024-03-22 ENCOUNTER — Other Ambulatory Visit (HOSPITAL_COMMUNITY): Payer: Self-pay

## 2024-03-22 MED ORDER — LAMOTRIGINE 200 MG PO TABS
200.0000 mg | ORAL_TABLET | Freq: Every day | ORAL | 0 refills | Status: DC
  Filled 2024-03-22: qty 30, 30d supply, fill #0

## 2024-03-22 NOTE — Telephone Encounter (Signed)
 Spoke with patient and she will call back at 10:30 am when she is at work and has her schedule

## 2024-03-22 NOTE — Telephone Encounter (Signed)
Pt is scheduled for 11/12.

## 2024-03-22 NOTE — Telephone Encounter (Signed)
 Past due for FU, please call to schedule. Has not responded to MyChart msg.

## 2024-03-27 ENCOUNTER — Other Ambulatory Visit: Payer: Self-pay

## 2024-03-27 ENCOUNTER — Encounter: Payer: Self-pay | Admitting: Adult Health

## 2024-03-27 ENCOUNTER — Other Ambulatory Visit (HOSPITAL_COMMUNITY): Payer: Self-pay

## 2024-03-27 ENCOUNTER — Telehealth (INDEPENDENT_AMBULATORY_CARE_PROVIDER_SITE_OTHER): Admitting: Adult Health

## 2024-03-27 DIAGNOSIS — F319 Bipolar disorder, unspecified: Secondary | ICD-10-CM | POA: Diagnosis not present

## 2024-03-27 DIAGNOSIS — F900 Attention-deficit hyperactivity disorder, predominantly inattentive type: Secondary | ICD-10-CM | POA: Diagnosis not present

## 2024-03-27 DIAGNOSIS — F4010 Social phobia, unspecified: Secondary | ICD-10-CM

## 2024-03-27 MED ORDER — BUPROPION HCL ER (XL) 300 MG PO TB24
ORAL_TABLET | Freq: Every day | ORAL | 3 refills | Status: DC
  Filled 2024-03-27: qty 90, fill #0

## 2024-03-27 MED ORDER — LAMOTRIGINE 25 MG PO TABS
50.0000 mg | ORAL_TABLET | Freq: Every evening | ORAL | 1 refills | Status: AC
  Filled 2024-03-27: qty 180, 90d supply, fill #0
  Filled 2024-05-08: qty 80, 40d supply, fill #1

## 2024-03-27 MED ORDER — AMPHETAMINE-DEXTROAMPHETAMINE 20 MG PO TABS: 20.0000 mg | ORAL_TABLET | Freq: Two times a day (BID) | ORAL | 0 refills | Status: AC

## 2024-03-27 MED ORDER — BUPROPION HCL ER (XL) 300 MG PO TB24
ORAL_TABLET | Freq: Every day | ORAL | Status: AC
Start: 2024-03-27 — End: 2025-03-27

## 2024-03-27 MED ORDER — CLONAZEPAM 0.5 MG PO TABS
0.5000 mg | ORAL_TABLET | Freq: Every day | ORAL | 2 refills | Status: AC | PRN
  Filled 2024-03-27: qty 30, 30d supply, fill #0

## 2024-03-27 MED ORDER — QUETIAPINE FUMARATE 25 MG PO TABS
25.0000 mg | ORAL_TABLET | Freq: Every day | ORAL | 3 refills | Status: AC
  Filled 2024-03-27: qty 90, 90d supply, fill #0

## 2024-03-27 MED ORDER — AMPHETAMINE-DEXTROAMPHETAMINE 20 MG PO TABS
20.0000 mg | ORAL_TABLET | Freq: Two times a day (BID) | ORAL | 0 refills | Status: AC
  Filled 2024-03-27: qty 60, 30d supply, fill #0

## 2024-03-27 MED ORDER — BUSPIRONE HCL 10 MG PO TABS
10.0000 mg | ORAL_TABLET | Freq: Two times a day (BID) | ORAL | 3 refills | Status: AC
  Filled 2024-03-27: qty 180, 90d supply, fill #0

## 2024-03-27 MED ORDER — LAMOTRIGINE 200 MG PO TABS
200.0000 mg | ORAL_TABLET | Freq: Every day | ORAL | 0 refills | Status: AC
  Filled 2024-03-27: qty 30, 30d supply, fill #0

## 2024-03-27 NOTE — Progress Notes (Signed)
 Dawn Crosby [id number] Apr 22, 2001 23 y.o.  Virtual Visit via Video Note  I connected with pt @ on 03/27/24 at 11:30 AM EST by a video enabled telemedicine application and verified that I am speaking with the correct person using two identifiers.   I discussed the limitations of evaluation and management by telemedicine and the availability of in person appointments. The patient expressed understanding and agreed to proceed.  I discussed the assessment and treatment plan with the patient. The patient was provided an opportunity to ask questions and all were answered. The patient agreed with the plan and demonstrated an understanding of the instructions.   The patient was advised to call back or seek an in-person evaluation if the symptoms worsen or if the condition fails to improve as anticipated.  I provided 25 minutes of non-face-to-face time during this encounter.  The patient was located at home.  The provider was located at [facility].   Angeline LOISE Sayers, NP   Subjective:   Patient ID:  Dawn Crosby is a 23 y.o. (DOB 2000/12/10) female.  Chief Complaint: No chief complaint on file.   HPI Dawn Crosby presents for follow-up of Bipolar disorder, ADHD, SAD, Social phobia.  Describes mood today as ok. Pleasant. Denies tearfulness. Mood symptoms - denies depression, anxiety, and irritability. Reports stable interest and motivation.Reports one recent panic attack - after being sick. Denies worry, rumination and over thinking. Reports mood is stable. Stating I feel like I'm doing alright. Feels like current medications are helpful.  Taking medications as prescribed.  Energy levels stable. Active, does not have a regular exercise routine. Walking.  Enjoys some usual interests and activities. Single. Living at home. Spending time with family. Appetite adequate. Weight gain - 270 pounds. Sleeps well most nights. Averages 6 to 8 hours. Focus and concentration  stable. Completing tasks. Managing aspects of household. 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  Denies SI or HI. Denies AH or VH. Denies self harm. Denies substance use. Denies alcohol use.  Seeing therapist - Harlene RAMAN.  Review of Systems:  Review of Systems  Musculoskeletal:  Negative for gait problem.  Neurological:  Negative for tremors.  Psychiatric/Behavioral:         Please refer to HPI    Medications: I have reviewed the patient's current medications.  Current Outpatient Medications  Medication Sig Dispense Refill   amphetamine -dextroamphetamine  (ADDERALL) 20 MG tablet Take 1 tablet (20 mg total) by mouth 2 (two) times daily. 60 tablet 0   [START ON 04/24/2024] amphetamine -dextroamphetamine  (ADDERALL) 20 MG tablet Take 1 tablet (20 mg total) by mouth 2 (two) times daily. 60 tablet 0   [START ON 05/22/2024] amphetamine -dextroamphetamine  (ADDERALL) 20 MG tablet Take 1 tablet (20 mg total) by mouth 2 (two) times daily. 60 tablet 0   azithromycin  (ZITHROMAX ) 500 MG tablet Take 1 tablet (500 mg total) by mouth daily for 3 days 3 tablet 0   buPROPion  (WELLBUTRIN  XL) 300 MG 24 hr tablet TAKE 1 TABLET BY MOUTH ONCE DAILY     busPIRone  (BUSPAR ) 10 MG tablet Take 1 tablet (10 mg total) by mouth 2 (two) times daily. 180 tablet 3   cetirizine (ZYRTEC) 10 MG tablet Take 10 mg by mouth at bedtime.     clonazePAM  (KLONOPIN ) 0.5 MG tablet Take 1 tablet (0.5 mg total) by mouth daily as needed for anxiety or panic 30 tablet 2   co-enzyme Q-10 30 MG capsule Take 30 mg by mouth daily.  diphenhydrAMINE  (BENADRYL ) 25 mg capsule Take 1 each (25 mg total) by mouth every 6 (six) hours as needed for allergies for up to 5 days. 12 capsule 0   docusate sodium (COLACE) 100 MG capsule Take 100 mg by mouth at bedtime.     ibuprofen  (ADVIL ) 800 MG tablet Take 1 tablet by mouth every 8 hours with food 30 tablet 0   lamoTRIgine  (LAMICTAL ) 200 MG tablet Take 1 tablet (200 mg total) by mouth daily.  30 tablet 0   lamoTRIgine  (LAMICTAL ) 25 MG tablet Take 2 tablets (50 mg total) by mouth at bedtime. 180 tablet 1   melatonin 5 MG TABS Take 5 mg by mouth at bedtime.     Multiple Vitamin (MULTIVITAMIN) capsule Take 1 capsule by mouth daily.     Omega-3 1000 MG CAPS Take 1 capsule by mouth at bedtime.     QUEtiapine  (SEROQUEL ) 25 MG tablet Take 1 tablet (25 mg total) by mouth at bedtime. 90 tablet 3   No current facility-administered medications for this visit.    Medication Side Effects: None  Allergies:  Allergies  Allergen Reactions   Bactrim  [Sulfamethoxazole -Trimethoprim ] Hives    Possible hives; pt was on bactrim  and pcn, unsure which caused hives   Clindamycin /Lincomycin Hives   Kiwi Extract Other (See Comments)    Tongue tingling  Other reaction(s): tongue tingle   Penicillins Hives    Possible hives; pt was on bactrim  and pcn, unsure which caused hives   Pineapple Other (See Comments)    Tongue allergy  Other reaction(s): tongue tingle   Prozac [Fluoxetine Hcl] Hives   Adhesive [Tape] Rash   Clindamycin  Rash   Fluoxetine Rash    Other reaction(s): hives, itching    Past Medical History:  Diagnosis Date   ADHD    Anxiety    Asthma    as child    Bipolar disorder (HCC)    Depression     Family History  Problem Relation Age of Onset   Diabetes Mother    Depression Mother    Hyperlipidemia Father    Hypertension Father    Diabetes Father    Hyperlipidemia Maternal Grandfather     Social History   Socioeconomic History   Marital status: Single    Spouse name: Not on file   Number of children: Not on file   Years of education: Not on file   Highest education level: Not on file  Occupational History   Not on file  Tobacco Use   Smoking status: Never   Smokeless tobacco: Never  Vaping Use   Vaping status: Some Days  Substance and Sexual Activity   Alcohol use: Not Currently   Drug use: Not Currently   Sexual activity: Not on file  Other Topics  Concern   Not on file  Social History Narrative   Not on file   Social Drivers of Health   Financial Resource Strain: Not on file  Food Insecurity: Not on file  Transportation Needs: Not on file  Physical Activity: Not on file  Stress: Not on file  Social Connections: Not on file  Intimate Partner Violence: Not on file    Past Medical History, Surgical history, Social history, and Family history were reviewed and updated as appropriate.   Please see review of systems for further details on the patient's review from today.   Objective:   Physical Exam:  There were no vitals taken for this visit.  Physical Exam Constitutional:  General: She is not in acute distress. Musculoskeletal:        General: No deformity.  Neurological:     Mental Status: She is alert and oriented to person, place, and time.     Coordination: Coordination normal.  Psychiatric:        Attention and Perception: Attention and perception normal. She does not perceive auditory or visual hallucinations.        Mood and Affect: Mood normal. Mood is not anxious or depressed. Affect is not labile, blunt, angry or inappropriate.        Speech: Speech normal.        Behavior: Behavior normal.        Thought Content: Thought content normal. Thought content is not paranoid or delusional. Thought content does not include homicidal or suicidal ideation. Thought content does not include homicidal or suicidal plan.        Cognition and Memory: Cognition and memory normal.        Judgment: Judgment normal.     Comments: Insight intact     Lab Review:     Component Value Date/Time   NA 139 04/01/2021 1143   K 3.9 04/01/2021 1143   CL 104 04/01/2021 1143   CO2 28 04/01/2021 1143   GLUCOSE 87 04/01/2021 1143   BUN 12 04/01/2021 1143   CREATININE 0.91 04/01/2021 1143   CREATININE 0.69 12/31/2015 0001   CALCIUM 9.6 04/01/2021 1143   PROT 7.1 04/01/2021 1143   ALBUMIN 4.4 04/01/2021 1143   AST 14  04/01/2021 1143   ALT 13 04/01/2021 1143   ALKPHOS 70 04/01/2021 1143   BILITOT 0.3 04/01/2021 1143       Component Value Date/Time   WBC 7.8 04/01/2021 1143   RBC 4.41 04/01/2021 1143   HGB 13.0 04/01/2021 1143   HCT 38.3 04/01/2021 1143   PLT 335.0 04/01/2021 1143   MCV 86.9 04/01/2021 1143   MCHC 34.1 04/01/2021 1143   RDW 12.5 04/01/2021 1143    No results found for: POCLITH, LITHIUM   No results found for: PHENYTOIN, PHENOBARB, VALPROATE, CBMZ   .res Assessment: Plan:    Plan:  PDMP reviewed  Adderall 20mg  BID Buspar  10mg  BID Lamictal  200mg  daily Lamictal  50mg  daily Wellbutrin  XL 300mg  in the am  Seroquel  100mg  - 1/2 to one tablet as needed Clonazepam  0.5mg  as needed for anxiety  RTC 3 months    15 minutes spent dedicated to the care of this patient on the date of this encounter to include pre-visit review of records, ordering of medication, post visit documentation, and face-to-face time with the patient discussing Bipolar disorder, ADHD, SAD, Social phobia. Discussed continuing current medication regimen.  Discussed potential benefits, risk, and side effects of benzodiazepines to include potential risk of tolerance and dependence, as well as possible drowsiness. Advised patient not to drive if experiencing drowsiness and to take lowest possible effective dose to minimize risk of dependence and tolerance.   Discussed potential benefits, risks, and side effects of stimulants with patient to include increased heart rate, palpitations, insomnia, increased anxiety, increased irritability, or decreased appetite.  Instructed patient to contact office if experiencing any significant tolerability issues.   Discussed potential metabolic side effects associated with atypical antipsychotics, as well as potential risk for movement side effects. Advised pt to contact office if movement side effects occur.    Counseled patient regarding potential benefits, risks,  and side effects of Lamictal  to include potential risk of Stevens-Johnson syndrome. Advised patient to  stop taking Lamictal  and contact office immediately if rash develops and to seek urgent medical attention if rash is severe and/or spreading quickly.   Diagnoses and all orders for this visit:  Bipolar I disorder (HCC) -     lamoTRIgine  (LAMICTAL ) 200 MG tablet; Take 1 tablet (200 mg total) by mouth daily. -     lamoTRIgine  (LAMICTAL ) 25 MG tablet; Take 2 tablets (50 mg total) by mouth at bedtime. -     QUEtiapine  (SEROQUEL ) 25 MG tablet; Take 1 tablet (25 mg total) by mouth at bedtime. -     buPROPion  (WELLBUTRIN  XL) 300 MG 24 hr tablet; TAKE 1 TABLET BY MOUTH ONCE DAILY  ADHD (attention deficit hyperactivity disorder), inattentive type -     amphetamine -dextroamphetamine  (ADDERALL) 20 MG tablet; Take 1 tablet (20 mg total) by mouth 2 (two) times daily. -     amphetamine -dextroamphetamine  (ADDERALL) 20 MG tablet; Take 1 tablet (20 mg total) by mouth 2 (two) times daily. -     amphetamine -dextroamphetamine  (ADDERALL) 20 MG tablet; Take 1 tablet (20 mg total) by mouth 2 (two) times daily.  Social anxiety disorder -     busPIRone  (BUSPAR ) 10 MG tablet; Take 1 tablet (10 mg total) by mouth 2 (two) times daily. -     clonazePAM  (KLONOPIN ) 0.5 MG tablet; Take 1 tablet (0.5 mg total) by mouth daily as needed for anxiety or panic  Social phobia  Other orders -     Discontinue: buPROPion  (WELLBUTRIN  XL) 300 MG 24 hr tablet; TAKE 1 TABLET BY MOUTH ONCE DAILY     Please see After Visit Summary for patient specific instructions.  No future appointments.   No orders of the defined types were placed in this encounter.     -------------------------------

## 2024-03-30 ENCOUNTER — Other Ambulatory Visit (HOSPITAL_COMMUNITY): Payer: Self-pay

## 2024-05-08 ENCOUNTER — Other Ambulatory Visit (HOSPITAL_COMMUNITY): Payer: Self-pay

## 2024-05-08 ENCOUNTER — Other Ambulatory Visit: Payer: Self-pay

## 2024-05-24 ENCOUNTER — Other Ambulatory Visit: Payer: Self-pay

## 2024-06-13 ENCOUNTER — Other Ambulatory Visit (HOSPITAL_COMMUNITY): Payer: Self-pay

## 2024-06-27 ENCOUNTER — Telehealth: Admitting: Adult Health
